# Patient Record
Sex: Female | Born: 1965
Health system: Southern US, Community
[De-identification: ages and names within clinical notes are randomized; demographics above are authoritative.]

## PROBLEM LIST (undated history)

## (undated) DIAGNOSIS — C50919 Malignant neoplasm of unspecified site of unspecified female breast: Secondary | ICD-10-CM

## (undated) DIAGNOSIS — C50412 Malignant neoplasm of upper-outer quadrant of left female breast: Principal | ICD-10-CM

## (undated) HISTORY — PX: WISDOM TOOTH EXTRACTION: SHX21

## (undated) HISTORY — DX: Malignant neoplasm of upper-outer quadrant of left female breast: C50.412

---

## 2000-05-04 ENCOUNTER — Other Ambulatory Visit: Admission: RE | Admit: 2000-05-04 | Discharge: 2000-05-04 | Payer: Self-pay | Admitting: Obstetrics and Gynecology

## 2002-06-26 ENCOUNTER — Other Ambulatory Visit: Admission: RE | Admit: 2002-06-26 | Discharge: 2002-06-26 | Payer: Self-pay | Admitting: Obstetrics and Gynecology

## 2005-11-09 ENCOUNTER — Other Ambulatory Visit: Admission: RE | Admit: 2005-11-09 | Discharge: 2005-11-09 | Payer: Self-pay | Admitting: Obstetrics and Gynecology

## 2005-12-14 ENCOUNTER — Ambulatory Visit (HOSPITAL_COMMUNITY): Admission: RE | Admit: 2005-12-14 | Discharge: 2005-12-14 | Payer: Self-pay | Admitting: Obstetrics and Gynecology

## 2008-01-16 ENCOUNTER — Ambulatory Visit (HOSPITAL_COMMUNITY): Admission: RE | Admit: 2008-01-16 | Discharge: 2008-01-16 | Payer: Self-pay | Admitting: Cardiology

## 2010-02-05 ENCOUNTER — Ambulatory Visit (HOSPITAL_COMMUNITY): Admission: RE | Admit: 2010-02-05 | Discharge: 2010-02-05 | Payer: Self-pay | Admitting: Family Medicine

## 2012-06-21 ENCOUNTER — Other Ambulatory Visit (HOSPITAL_COMMUNITY): Payer: Self-pay | Admitting: Family Medicine

## 2012-06-21 DIAGNOSIS — Z1231 Encounter for screening mammogram for malignant neoplasm of breast: Secondary | ICD-10-CM

## 2012-07-09 ENCOUNTER — Ambulatory Visit (HOSPITAL_COMMUNITY)
Admission: RE | Admit: 2012-07-09 | Discharge: 2012-07-09 | Disposition: A | Discharge status: Home / Self Care | Payer: 59 | Source: Ambulatory Visit | Attending: Family Medicine | Admitting: Family Medicine

## 2012-07-09 DIAGNOSIS — Z1231 Encounter for screening mammogram for malignant neoplasm of breast: Secondary | ICD-10-CM | POA: Insufficient documentation

## 2012-08-15 ENCOUNTER — Ambulatory Visit
Admission: RE | Admit: 2012-08-15 | Discharge: 2012-08-15 | Disposition: A | Discharge status: Home / Self Care | Payer: 59 | Source: Ambulatory Visit | Attending: Family Medicine | Admitting: Family Medicine

## 2012-08-15 ENCOUNTER — Other Ambulatory Visit: Payer: Self-pay | Admitting: Family Medicine

## 2012-08-15 DIAGNOSIS — R9389 Abnormal findings on diagnostic imaging of other specified body structures: Secondary | ICD-10-CM

## 2012-08-15 MED ORDER — IOHEXOL 300 MG/ML  SOLN
75.0000 mL | Freq: Once | INTRAMUSCULAR | Status: AC | PRN
  Administered 2012-08-15: 75 mL via INTRAVENOUS

## 2012-08-21 ENCOUNTER — Other Ambulatory Visit: Payer: Self-pay | Admitting: Family Medicine

## 2012-08-21 DIAGNOSIS — E041 Nontoxic single thyroid nodule: Secondary | ICD-10-CM

## 2012-08-24 ENCOUNTER — Ambulatory Visit
Admission: RE | Admit: 2012-08-24 | Discharge: 2012-08-24 | Disposition: A | Discharge status: Home / Self Care | Payer: 59 | Source: Ambulatory Visit | Attending: Family Medicine | Admitting: Family Medicine

## 2012-08-24 DIAGNOSIS — E041 Nontoxic single thyroid nodule: Secondary | ICD-10-CM

## 2013-01-23 ENCOUNTER — Other Ambulatory Visit: Payer: Self-pay | Admitting: Internal Medicine

## 2013-01-23 DIAGNOSIS — M7989 Other specified soft tissue disorders: Secondary | ICD-10-CM

## 2013-02-15 ENCOUNTER — Ambulatory Visit
Admission: RE | Admit: 2013-02-15 | Discharge: 2013-02-15 | Disposition: A | Discharge status: Home / Self Care | Payer: 59 | Source: Ambulatory Visit | Attending: Internal Medicine | Admitting: Internal Medicine

## 2013-02-15 ENCOUNTER — Other Ambulatory Visit: Payer: 59

## 2013-02-15 DIAGNOSIS — M7989 Other specified soft tissue disorders: Secondary | ICD-10-CM

## 2014-11-14 ENCOUNTER — Encounter (INDEPENDENT_AMBULATORY_CARE_PROVIDER_SITE_OTHER): Payer: BC Managed Care – PPO | Admitting: Ophthalmology

## 2014-11-14 DIAGNOSIS — H3562 Retinal hemorrhage, left eye: Secondary | ICD-10-CM

## 2014-11-14 DIAGNOSIS — H43813 Vitreous degeneration, bilateral: Secondary | ICD-10-CM

## 2014-11-14 DIAGNOSIS — H35711 Central serous chorioretinopathy, right eye: Secondary | ICD-10-CM

## 2015-11-23 ENCOUNTER — Ambulatory Visit (INDEPENDENT_AMBULATORY_CARE_PROVIDER_SITE_OTHER): Payer: BLUE CROSS/BLUE SHIELD | Admitting: Ophthalmology

## 2015-11-23 DIAGNOSIS — H43813 Vitreous degeneration, bilateral: Secondary | ICD-10-CM | POA: Diagnosis not present

## 2015-11-23 DIAGNOSIS — H35721 Serous detachment of retinal pigment epithelium, right eye: Secondary | ICD-10-CM

## 2016-06-14 ENCOUNTER — Other Ambulatory Visit: Payer: Self-pay | Admitting: Internal Medicine

## 2016-06-14 DIAGNOSIS — Z1231 Encounter for screening mammogram for malignant neoplasm of breast: Secondary | ICD-10-CM

## 2016-06-16 ENCOUNTER — Other Ambulatory Visit: Payer: Self-pay | Admitting: Internal Medicine

## 2016-06-16 DIAGNOSIS — N6320 Unspecified lump in the left breast, unspecified quadrant: Secondary | ICD-10-CM

## 2016-06-21 ENCOUNTER — Other Ambulatory Visit: Payer: Self-pay

## 2016-06-22 ENCOUNTER — Other Ambulatory Visit: Payer: Self-pay | Admitting: Radiology

## 2016-06-23 ENCOUNTER — Ambulatory Visit: Payer: Self-pay

## 2016-06-24 ENCOUNTER — Encounter: Payer: Self-pay | Admitting: *Deleted

## 2016-06-24 ENCOUNTER — Telehealth: Payer: Self-pay | Admitting: *Deleted

## 2016-06-24 DIAGNOSIS — C50412 Malignant neoplasm of upper-outer quadrant of left female breast: Secondary | ICD-10-CM | POA: Insufficient documentation

## 2016-06-24 HISTORY — DX: Malignant neoplasm of upper-outer quadrant of left female breast: C50.412

## 2016-06-24 NOTE — Telephone Encounter (Signed)
Confirmed [facility] for 07/06/16 at 0815 .  Instructions and contact information given.

## 2016-06-29 ENCOUNTER — Other Ambulatory Visit: Payer: Self-pay | Admitting: Audiologist

## 2016-06-29 ENCOUNTER — Other Ambulatory Visit: Payer: Self-pay | Admitting: Radiology

## 2016-06-29 DIAGNOSIS — C50912 Malignant neoplasm of unspecified site of left female breast: Secondary | ICD-10-CM

## 2016-07-05 ENCOUNTER — Ambulatory Visit
Admission: RE | Admit: 2016-07-05 | Discharge: 2016-07-05 | Disposition: A | Discharge status: Home / Self Care | Payer: BLUE CROSS/BLUE SHIELD | Source: Ambulatory Visit | Attending: Radiology | Admitting: Radiology

## 2016-07-05 DIAGNOSIS — C50912 Malignant neoplasm of unspecified site of left female breast: Secondary | ICD-10-CM

## 2016-07-05 MED ORDER — GADOBENATE DIMEGLUMINE 529 MG/ML IV SOLN: 12.0000 mL | Freq: Once | INTRAVENOUS | Status: DC | PRN

## 2016-07-06 ENCOUNTER — Other Ambulatory Visit (HOSPITAL_BASED_OUTPATIENT_CLINIC_OR_DEPARTMENT_OTHER): Payer: BLUE CROSS/BLUE SHIELD

## 2016-07-06 ENCOUNTER — Ambulatory Visit
Admission: RE | Admit: 2016-07-06 | Discharge: 2016-07-06 | Disposition: A | Discharge status: Home / Self Care | Payer: BLUE CROSS/BLUE SHIELD | Source: Ambulatory Visit | Attending: Radiation Oncology | Admitting: Radiation Oncology

## 2016-07-06 ENCOUNTER — Encounter: Payer: Self-pay | Admitting: Hematology and Oncology

## 2016-07-06 ENCOUNTER — Encounter: Payer: Self-pay | Admitting: Physical Therapy

## 2016-07-06 ENCOUNTER — Ambulatory Visit (HOSPITAL_BASED_OUTPATIENT_CLINIC_OR_DEPARTMENT_OTHER): Payer: BLUE CROSS/BLUE SHIELD | Admitting: Hematology and Oncology

## 2016-07-06 ENCOUNTER — Other Ambulatory Visit: Payer: Self-pay | Admitting: General Surgery

## 2016-07-06 ENCOUNTER — Ambulatory Visit: Payer: BLUE CROSS/BLUE SHIELD | Attending: General Surgery | Admitting: Physical Therapy

## 2016-07-06 ENCOUNTER — Encounter: Payer: Self-pay | Admitting: Skilled Nursing Facility1

## 2016-07-06 DIAGNOSIS — C50412 Malignant neoplasm of upper-outer quadrant of left female breast: Secondary | ICD-10-CM

## 2016-07-06 DIAGNOSIS — Z8042 Family history of malignant neoplasm of prostate: Secondary | ICD-10-CM

## 2016-07-06 DIAGNOSIS — C50912 Malignant neoplasm of unspecified site of left female breast: Secondary | ICD-10-CM

## 2016-07-06 DIAGNOSIS — Z803 Family history of malignant neoplasm of breast: Secondary | ICD-10-CM | POA: Diagnosis not present

## 2016-07-06 DIAGNOSIS — R293 Abnormal posture: Secondary | ICD-10-CM | POA: Diagnosis present

## 2016-07-06 LAB — COMPREHENSIVE METABOLIC PANEL
ALT: 11 U/L (ref 0–55)
ANION GAP: 9 meq/L (ref 3–11)
AST: 18 U/L (ref 5–34)
Albumin: 4.1 g/dL (ref 3.5–5.0)
Alkaline Phosphatase: 55 U/L (ref 40–150)
BUN: 10.4 mg/dL (ref 7.0–26.0)
CALCIUM: 9.6 mg/dL (ref 8.4–10.4)
CHLORIDE: 105 meq/L (ref 98–109)
CO2: 26 mEq/L (ref 22–29)
CREATININE: 0.7 mg/dL (ref 0.6–1.1)
Glucose: 82 mg/dl (ref 70–140)
POTASSIUM: 3.7 meq/L (ref 3.5–5.1)
Sodium: 141 mEq/L (ref 136–145)
Total Bilirubin: 0.5 mg/dL (ref 0.20–1.20)
Total Protein: 7 g/dL (ref 6.4–8.3)

## 2016-07-06 LAB — CBC WITH DIFFERENTIAL/PLATELET
BASO%: 0.7 % (ref 0.0–2.0)
Basophils Absolute: 0 10*3/uL (ref 0.0–0.1)
EOS%: 2.9 % (ref 0.0–7.0)
Eosinophils Absolute: 0.1 10*3/uL (ref 0.0–0.5)
HEMATOCRIT: 42.5 % (ref 34.8–46.6)
HGB: 14.2 g/dL (ref 11.6–15.9)
LYMPH%: 35.7 % (ref 14.0–49.7)
MCH: 32.3 pg (ref 25.1–34.0)
MCHC: 33.4 g/dL (ref 31.5–36.0)
MCV: 96.6 fL (ref 79.5–101.0)
MONO#: 0.5 10*3/uL (ref 0.1–0.9)
MONO%: 12.2 % (ref 0.0–14.0)
NEUT#: 2.2 10*3/uL (ref 1.5–6.5)
NEUT%: 48.5 % (ref 38.4–76.8)
PLATELETS: 210 10*3/uL (ref 145–400)
RBC: 4.4 10*6/uL (ref 3.70–5.45)
RDW: 13.4 % (ref 11.2–14.5)
WBC: 4.4 10*3/uL (ref 3.9–10.3)
lymph#: 1.6 10*3/uL (ref 0.9–3.3)

## 2016-07-06 NOTE — Patient Instructions (Signed)

## 2016-07-06 NOTE — Progress Notes (Signed)
[facility] CONSULT NOTE  Patient Care Team: Merrilee Seashore, MD as PCP - General (Internal Medicine) Rolm Bookbinder, MD as Consulting Physician (General Surgery) Nicholas Lose, MD as Consulting Physician (Hematology and Oncology) Kyung Rudd, MD as Consulting Physician (Radiation Oncology)  CHIEF COMPLAINTS/PURPOSE OF CONSULTATION:  Newly diagnosed breast cancer  HISTORY OF PRESENTING ILLNESS:  Stephanie Dennis 50 y.o. female is here because of recent diagnosis of left breast cancer. Patient initially had a screening mammogram which did not show any abnormalities. She however felt lumps in her breasts and in back in for reevaluation. She was found to have multiple breast nodules measuring 2.9 cm and 1:00 position, 0.6 cm at 1:00 position and 1.4 cm at 12:30 position. She also had what appeared to be fibroadenomas and right and left breast. Ultrasound-guided biopsy was performed which revealed invasive lobular carcinoma with lobular carcinoma in situ grade 2 along with lymphovascular invasion that was ER/PR positive HER-2 negative with a Ki-67 of 3%. She underwent a breast MRI which revealed a 5.5 cm central mass with multiple satellite nodules the overall span was 7.2 cm multicentric disease. It was abutting the skin. She was presented this morning in the multidisciplinary tumor board and she is here today to discuss a treatment plan of the [facility] clinic.  I reviewed her records extensively and collaborated the history with the patient.  SUMMARY OF ONCOLOGIC HISTORY:   Breast cancer of upper-outer quadrant of left female breast ([place])   06/22/2016 Initial Diagnosis    Left breast biopsies subareolar: 3 biopsies showing multifocal invasive lobular carcinoma with LCIS grade 2 with lymphovascular invasion, ER 80%, PR 90%, Ki-67 3%, HER-2 negative ratio 1.13     07/05/2016 Breast MRI    Left breast biopsy 7:00: Grade 2 invasive ductal carcinoma, ER 95%, PR 95%, Ki-67 15%, HER-2 negative  ratio 1.48; left breast distortion 1.2 x 1.2 x 0.8 cm; left breast cyst 1.1 cm, T1b N0 stage IA clinical stage      In terms of breast cancer risk profile:  She menarched at early age of 41  She had 2 pregnancy, her first child was born at age 55  She has received birth control pills for approximately 20 years.  She was never exposed to fertility medications or hormone replacement therapy.  She has  family history of Breast/GYN/GI cancer Mother 51 breast cancer Father 23 prostate cancer  MEDICAL HISTORY:  Past Medical History:  Diagnosis Date  . Breast cancer of upper-outer quadrant of left female breast ([place]) 06/24/2016    SURGICAL HISTORY: History reviewed. No pertinent surgical history.  SOCIAL HISTORY: Social History   Social History  . Marital status: Single    Spouse name: N/A  . Number of children: N/A  . Years of education: N/A   Occupational History  . Not on file.   Social History Main Topics  . Smoking status: Never Smoker  . Smokeless tobacco: Never Used  . Alcohol use 0.6 - 1.2 oz/week    1 - 2 Glasses of wine per week  . Drug use: No  . Sexual activity: Not on file   Other Topics Concern  . Not on file   Social History Narrative  . No narrative on file    FAMILY HISTORY: Family History  Problem Relation Age of Onset  . Breast cancer Mother   . Prostate cancer Father     ALLERGIES:  has No Known Allergies.  MEDICATIONS:  No current outpatient prescriptions on file.  No current facility-administered medications for this visit.     REVIEW OF SYSTEMS:   Constitutional: Denies fevers, chills or abnormal night sweats Eyes: Denies blurriness of vision, double vision or watery eyes Ears, nose, mouth, throat, and face: Denies mucositis or sore throat Respiratory: Denies cough, dyspnea or wheezes Cardiovascular: Denies palpitation, chest discomfort or lower extremity swelling Gastrointestinal:  Denies nausea, heartburn or change in bowel  habits Skin: Denies abnormal skin rashes Lymphatics: Denies new lymphadenopathy or easy bruising Neurological:Denies numbness, tingling or new weaknesses Behavioral/Psych: Mood is stable, no new changes  Breast:  Multiple palpable nodules in the left breast All other systems were reviewed with the patient and are negative.  PHYSICAL EXAMINATION: ECOG PERFORMANCE STATUS: 1 - Symptomatic but completely ambulatory  Vitals:   07/06/16 0845  BP: 137/85  Pulse: 70  Resp: 18  Temp: 98.6 F (37 C)   Filed Weights   07/06/16 0845  Weight: 131 lb 11.2 oz (59.7 kg)    GENERAL:alert, no distress and comfortable SKIN: skin color, texture, turgor are normal, no rashes or significant lesions EYES: normal, conjunctiva are pink and non-injected, sclera clear OROPHARYNX:no exudate, no erythema and lips, buccal mucosa, and tongue normal  NECK: supple, thyroid normal size, non-tender, without nodularity LYMPH:  no palpable lymphadenopathy in the cervical, axillary or inguinal LUNGS: clear to auscultation and percussion with normal breathing effort HEART: regular rate & rhythm and no murmurs and no lower extremity edema ABDOMEN:abdomen soft, non-tender and normal bowel sounds Musculoskeletal:no cyanosis of digits and no clubbing  PSYCH: alert & oriented x 3 with fluent speech NEURO: no focal motor/sensory deficits BREAST: Multiple palpable nodules in the left breast. No palpable axillary or supraclavicular lymphadenopathy (exam performed in the presence of a chaperone)   LABORATORY DATA:  I have reviewed the data as listed Lab Results  Component Value Date   WBC 4.4 07/06/2016   HGB 14.2 07/06/2016   HCT 42.5 07/06/2016   MCV 96.6 07/06/2016   PLT 210 07/06/2016   Lab Results  Component Value Date   NA 141 07/06/2016   K 3.7 07/06/2016   CO2 26 07/06/2016    RADIOGRAPHIC STUDIES: I have personally reviewed the radiological reports and agreed with the findings in the  report.  ASSESSMENT AND PLAN:  Breast cancer of upper-outer quadrant of left female breast ([place]) Left breast biopsy 7:00 06/22/2016: Grade 2 invasive ductal carcinoma, ER 95%, PR 95%, Ki-67 15%, HER-2 negative ratio 1.48; left breast distortion 1.2 x 1.2 x 0.8 cm; left breast cyst 1.1 cm, T1b N0 stage IA clinical stage Left breast biopsies subareolar: 3 biopsies showing multifocal invasive lobular carcinoma with LCIS grade 2 with lymphovascular invasion, ER 80%, PR 90%, Ki-67 3%, HER-2 negative ratio 1.13  Pathology and radiology counseling:Discussed with the patient, the details of pathology including the type of breast cancer,the clinical staging, the significance of ER, PR and HER-2/neu receptors and the implications for treatment. After reviewing the pathology in detail, we proceeded to discuss the different treatment options between surgery, radiation, chemotherapy, antiestrogen therapies.  Recommendations: 1. Left mastectomy followed by 2. Oncotype DX testing to determine if chemotherapy would be of any benefit followed by 3. Adjuvant radiation therapy followed by 4. Adjuvant antiestrogen therapy  Patient does not qualify for genetic counseling because she does not have to family members and one side of the family with breast cancer. Having mother with breast cancer father with prostate cancer does not qualify her for genetic testing  Oncotype counseling: I  discussed Oncotype DX test. I explained to the patient that this is a 21 gene panel to evaluate patient tumors DNA to calculate recurrence score. This would help determine whether patient has high risk or intermediate risk or low risk breast cancer. She understands that if her tumor was found to be high risk, she would benefit from systemic chemotherapy. If low risk, no need of chemotherapy. If she was found to be intermediate risk, we would need to evaluate the score as well as other risk factors and determine if an abbreviated  chemotherapy may be of benefit.  Return to clinic after surgery to discuss final pathology report.  All questions were answered. The patient knows to call the clinic with any problems, questions or concerns.    Rulon Eisenmenger, MD 07/06/16

## 2016-07-06 NOTE — Assessment & Plan Note (Signed)
Left breast biopsy 7:00 06/22/2016: Grade 2 invasive ductal carcinoma, ER 95%, PR 95%, Ki-67 15%, HER-2 negative ratio 1.48; left breast distortion 1.2 x 1.2 x 0.8 cm; left breast cyst 1.1 cm, T1b N0 stage IA clinical stage Left breast biopsies subareolar: 3 biopsies showing multifocal invasive lobular carcinoma with LCIS grade 2 with lymphovascular invasion, ER 80%, PR 90%, Ki-67 3%, HER-2 negative ratio 1.13  Pathology and radiology counseling:Discussed with the patient, the details of pathology including the type of breast cancer,the clinical staging, the significance of ER, PR and HER-2/neu receptors and the implications for treatment. After reviewing the pathology in detail, we proceeded to discuss the different treatment options between surgery, radiation, chemotherapy, antiestrogen therapies.  Recommendations: 1. Left mastectomy followed by 2. Oncotype DX testing to determine if chemotherapy would be of any benefit followed by 3. Adjuvant radiation therapy followed by 4. Adjuvant antiestrogen therapy  Oncotype counseling: I discussed Oncotype DX test. I explained to the patient that this is a 21 gene panel to evaluate patient tumors DNA to calculate recurrence score. This would help determine whether patient has high risk or intermediate risk or low risk breast cancer. She understands that if her tumor was found to be high risk, she would benefit from systemic chemotherapy. If low risk, no need of chemotherapy. If she was found to be intermediate risk, we would need to evaluate the score as well as other risk factors and determine if an abbreviated chemotherapy may be of benefit.  Return to clinic after surgery to discuss final pathology report.

## 2016-07-06 NOTE — Therapy (Signed)
[place], [place], [address] Phone: [phone]   Fax:  [phone]  Physical Therapy Evaluation  Patient Details  Name: Stephanie Dennis MRN: [id number] Date of Birth: 1966/01/10 Referring Provider: Dr. Rolm Bookbinder  Encounter Date: 07/06/2016      PT End of Session - 07/06/16 1301    Visit Number 1   Number of Visits 1   PT Start Time 0934   PT Stop Time 1000   PT Time Calculation (min) 26 min   Activity Tolerance Patient tolerated treatment well   Behavior During Therapy [facility] for tasks assessed/performed      Past Medical History:  Diagnosis Date  . Breast cancer of upper-outer quadrant of left female breast ([place]) 06/24/2016    History reviewed. No pertinent surgical history.  There were no vitals filed for this visit.       Subjective Assessment - 07/06/16 1302    Subjective Patient reports she is here today to meet with her medical team to be assessed for her newly diagnosed left breast cancer.   Patient is accompained by: Family member   Pertinent History Patient was diagnosed on 06/15/16 with left invasive lobular carcinoma.  It is ER/PR positive, HER2 negative with a Ki67 of 3%.  There are multiple masses totaling 7 cm.    Patient Stated Goals Reduce lymphedema risk and learn post op shoulder ROM HEP            [facility] PT Assessment - 07/06/16 0001      Assessment   Medical Diagnosis Left breast cancer   Referring Provider Dr. Rolm Bookbinder   Onset Date/Surgical Date 06/15/16   Hand Dominance Right   Prior Therapy none     Precautions   Precautions Other (comment)   Precaution Comments Active cancer     Restrictions   Weight Bearing Restrictions No     Balance Screen   Has the patient fallen in the past 6 months No   Has the patient had a decrease in activity level because of a fear of falling?  No   Is the patient reluctant to leave their home because of a fear  of falling?  No     Home Environment   Living Environment Private residence   Living Arrangements Children  74 y.o. twin sons   Available Help at Discharge Family     Prior Function   Level of Independence Independent   Vocation Full time employment   Vocation Requirements Works as an Corporate treasurer at [employer] She walks 4x/week for 45 minutes     Cognition   Overall Cognitive Status Within Functional Limits for tasks assessed     Posture/Postural Control   Posture/Postural Control Postural limitations   Postural Limitations Rounded Shoulders;Forward head     ROM / Strength   AROM / PROM / Strength AROM;Strength     AROM   AROM Assessment Site Shoulder;Cervical   Right/Left Shoulder Right;Left   Right Shoulder Extension 44 Degrees   Right Shoulder Flexion 148 Degrees   Right Shoulder ABduction 158 Degrees   Right Shoulder Internal Rotation 78 Degrees   Right Shoulder External Rotation 77 Degrees   Left Shoulder Extension 30 Degrees   Left Shoulder Flexion 140 Degrees   Left Shoulder ABduction 160 Degrees   Left Shoulder Internal Rotation 88 Degrees   Left Shoulder External Rotation 58 Degrees   Cervical Flexion WNL   Cervical Extension WNL  Cervical - Right Side Bend WNL   Cervical - Left Side Bend WNL   Cervical - Right Rotation WNL   Cervical - Left Rotation WNL     Strength   Overall Strength Within functional limits for tasks performed           LYMPHEDEMA/ONCOLOGY QUESTIONNAIRE - 07/06/16 1259      Type   Cancer Type Left breast cancer     Lymphedema Assessments   Lymphedema Assessments Upper extremities     Right Upper Extremity Lymphedema   10 cm Proximal to Olecranon Process 24.8 cm   Olecranon Process 23.3 cm   10 cm Proximal to Ulnar Styloid Process 20.1 cm   Just Proximal to Ulnar Styloid Process 14.5 cm   Across Hand at [employer] 18.2 cm   At [place] of 2nd Digit 5.8 cm     Left Upper Extremity Lymphedema   10 cm Proximal  to Olecranon Process 25.5 cm   Olecranon Process 24.4 cm   10 cm Proximal to Ulnar Styloid Process 20.1 cm   Just Proximal to Ulnar Styloid Process 14.4 cm   Across Hand at [employer] 17.1 cm   At [place] of 2nd Digit 5.7 cm      Patient was instructed today in a home exercise program today for post op shoulder range of motion. These included active assist shoulder flexion in sitting, scapular retraction, wall walking with shoulder abduction, and hands behind head external rotation.  She was encouraged to do these twice a day, holding 3 seconds and repeating 5 times when permitted by her physician.         PT Education - 07/06/16 1300    Education provided Yes   Education Details Lymphedema risk reduction and post op shoulder ROM HEP   Person(s) Educated Patient   Methods Explanation;Demonstration;Handout   Comprehension Returned demonstration;Verbalized understanding              Breast Clinic Goals - 07/06/16 1305      Patient will be able to verbalize understanding of pertinent lymphedema risk reduction practices relevant to her diagnosis specifically related to skin care.   Time 1   Period Days   Status Achieved     Patient will be able to return demonstrate and/or verbalize understanding of the post-op home exercise program related to regaining shoulder range of motion.   Time 1   Period Days   Status Achieved     Patient will be able to verbalize understanding of the importance of attending the postoperative After Breast Cancer Class for further lymphedema risk reduction education and therapeutic exercise.   Time 1   Period Days   Status Achieved              Plan - 07/06/16 1302    Clinical Impression Statement Patient was diagnosed on 06/15/16 with left invasive lobular carcinoma.  It is ER/PR positive, HER2 negative with a Ki67 of 3%.  There are multiple masses totaling 7 cm.  Her multidisciplinary medical team met prior to her assessment to determine  a recommended treatment plan.  She is planning to have a left mastectomy with a sentinel node biopsy followed by radiation and anti-estrogen therapy.  She may benefit from post op PT to regain shoulder ROM and reduce lymphedema risk.  Due to her lack of comorbidities, her eval is of low complexity.   Rehab Potential Excellent   Clinical Impairments Affecting Rehab Potential none   PT Frequency  One time visit   PT Treatment/Interventions Patient/family education;Therapeutic exercise   PT Next Visit Plan Will f/u after surgery   PT Home Exercise Plan Post op shoulder ROM HEP   Consulted and Agree with Plan of Care Patient;Family member/caregiver   Family Member Consulted Sister      Patient will benefit from skilled therapeutic intervention in order to improve the following deficits and impairments:  Decreased knowledge of precautions, Pain, Impaired UE functional use, Decreased range of motion  Visit Diagnosis: Left breast cancer with T3 tumor, >5 cm in greatest dimension (HCC) - Plan: PT plan of care cert/re-cert  Abnormal posture - Plan: PT plan of care cert/re-cert   Patient will follow up at outpatient cancer rehab if needed following surgery.  If the patient requires physical therapy at that time, a specific plan will be dictated and sent to the referring physician for approval. The patient was educated today on appropriate basic range of motion exercises to begin post operatively and the importance of attending the After Breast Cancer class following surgery.  Patient was educated today on lymphedema risk reduction practices as it pertains to recommendations that will benefit the patient immediately following surgery.  She verbalized good understanding.  No additional physical therapy is indicated at this time.      Problem List Patient Active Problem List   Diagnosis Date Noted  . Breast cancer of upper-outer quadrant of left female breast ([place]) 06/24/2016    Annia Friendly,  PT 07/06/16 1:07 PM  [place], [place], [address] Phone: [phone]   Fax:  [phone]  Name: AERITH CANAL MRN: [id number] Date of Birth: 1966-04-22

## 2016-07-06 NOTE — Progress Notes (Signed)
For the patient to understand and be given the tools to implement a healthy plant based diet during their cancer diagnosis.   Patient was seen today and found to be in good spirits and accompanied by her sister. Pts ht 67 in, 131 pounds, BMI 20.7. Pt states she usually likes all foods so she does not see a problem with implementing a plant based diet. Pt states she does skip multiple meals often throughout the day and week.    Dietitian educated the patient on implementing a plant based diet by incorporating more plant proteins, fruits, and vegetables. As a part of a healthy routine physical activity was discussed. Dietitian worked with the pt on some strategies to eat more frequently throughout the day and week. A folder of evidence based information with a focus on a plant based diet and general nutrition during cancer was given to the patient.  The importance of legitimate, evidence based information was discussed and examples were given. As a part of the continuum of care the cancer dietitian's contact information was given to the patient in the event they would like to have a follow up appointment.

## 2016-07-08 ENCOUNTER — Encounter (HOSPITAL_BASED_OUTPATIENT_CLINIC_OR_DEPARTMENT_OTHER): Payer: Self-pay | Admitting: *Deleted

## 2016-07-08 NOTE — Progress Notes (Signed)
Pt given 8 oz carton of boost breeze with written and verbal instructions to drink by 1100 am morning of surgery and no other liquid after midnight. Pt voiced understanding,

## 2016-07-10 NOTE — Progress Notes (Signed)
Radiation Oncology         [phone] ________________________________  Name: Stephanie Dennis MRN: [id number]  Date: 07/06/2016  DOB: 1966/11/04  UU:[id number],QQVZD, MD  Rolm Bookbinder, MD     REFERRING PHYSICIAN: Rolm Bookbinder, MD   DIAGNOSIS: The encounter diagnosis was Breast cancer of upper-outer quadrant of left female breast [facility]).  Breast cancer of upper-outer quadrant of left female breast [facility])   Staging form: Breast, AJCC 7th Edition   - Clinical stage from 07/06/2016: Stage IIB (T3(m), N0, M0) - Signed by Nicholas Lose, MD on 07/06/2016   HISTORY OF PRESENT ILLNESS:: Stephanie Dennis 50 y.o. female is here because of recent diagnosis of left breast cancer. Patient initially had a screening mammogram which did not show any abnormalities. She however felt lumps in her breasts and in back in for reevaluation. She was found to have multiple breast nodules measuring 2.9 cm and 1:00 position, 0.6 cm at 1:00 position and 1.4 cm at 12:30 position. She also had what appeared to be fibroadenomas and right and left breast. Ultrasound-guided biopsy was performed which revealed invasive lobular carcinoma with lobular carcinoma in situ grade 2 along with lymphovascular invasion that was ER/PR positive HER-2 negative with a Ki-67 of 3%. She underwent a breast MRI which revealed a 5.5 cm central mass with multiple satellite nodules the overall span was 7.2 cm multicentric disease. It was abutting the skin. She was presented this morning in the multidisciplinary tumor board and she is here today to discuss a treatment plan of the [facility] clinic.    PREVIOUS RADIATION THERAPY: No   PAST MEDICAL HISTORY:  has a past medical history of Breast cancer of upper-outer quadrant of left female breast ([place]) (06/24/2016).     PAST SURGICAL HISTORY: Past Surgical History:  Procedure Laterality Date  . CESAREAN SECTION    . WISDOM TOOTH EXTRACTION       FAMILY HISTORY: family history includes  Breast cancer in her mother; Prostate cancer in her father.   SOCIAL HISTORY:  reports that she has never smoked. She has never used smokeless tobacco. She reports that she drinks about 0.6 - 1.2 oz of alcohol per week . She reports that she does not use drugs.   ALLERGIES: Review of patient's allergies indicates no known allergies.   MEDICATIONS:  No current outpatient prescriptions on file.   No current facility-administered medications for this encounter.      REVIEW OF SYSTEMS:  A 15 point review of systems is documented in the electronic medical record. This was obtained by the nursing staff. However, I reviewed this with the patient to discuss relevant findings and make appropriate changes.  Pertinent items are noted in HPI.    PHYSICAL EXAM:  vitals were not taken for this visit.  ECOG = 0  0 - Asymptomatic (Fully active, able to carry on all predisease activities without restriction)  1 - Symptomatic but completely ambulatory (Restricted in physically strenuous activity but ambulatory and able to carry out work of a light or sedentary nature. For example, light housework, office work)  2 - Symptomatic, <50% in bed during the day (Ambulatory and capable of all self care but unable to carry out any work activities. Up and about more than 50% of waking hours)  3 - Symptomatic, >50% in bed, but not bedbound (Capable of only limited self-care, confined to bed or chair 50% or more of waking hours)  4 - Bedbound (Completely disabled. Cannot carry on any self-care.  Totally confined to bed or chair)  5 - Death   Eustace Pen MM, Creech RH, Tormey DC, et al. [phone]). "Toxicity and response criteria of the [facility]". [place] Oncol. 5 (6): 649-55  General: Well-developed, in no acute distress     LABORATORY DATA:  Lab Results  Component Value Date   WBC 4.4 07/06/2016   HGB 14.2 07/06/2016   HCT 42.5 07/06/2016   MCV 96.6 07/06/2016   PLT 210  07/06/2016   Lab Results  Component Value Date   NA 141 07/06/2016   K 3.7 07/06/2016   CO2 26 07/06/2016   Lab Results  Component Value Date   ALT 11 07/06/2016   AST 18 07/06/2016   ALKPHOS 55 07/06/2016   BILITOT 0.50 07/06/2016      RADIOGRAPHY: Mr Breast Bilateral W Wo Contrast  Result Date: 07/05/2016 CLINICAL DATA:  3 biopsy proven regions of breast cancer in the left breast. LABS:  None EXAM: BILATERAL BREAST MRI WITH AND WITHOUT CONTRAST TECHNIQUE: Multiplanar, multisequence MR images of both breasts were obtained prior to and following the intravenous administration of 12 ml of MultiHance. THREE-DIMENSIONAL MR IMAGE RENDERING ON INDEPENDENT WORKSTATION: Three-dimensional MR images were rendered by post-processing of the original MR data on an independent workstation. The three-dimensional MR images were interpreted, and findings are reported in the following complete MRI report for this study. Three dimensional images were evaluated at the independent DynaCad workstation COMPARISON:  Outside mammography June 15, 2016. Other mammograms since 2013. FINDINGS: Breast composition: Heterogeneously dense glandular tissue Background parenchymal enhancement: Moderate. Right breast: No MRI evidence of malignancy in the right breast. A few mildly prominent vessels are seen posteriorly in the medial right breast. No suspicious masses or non mass enhancement. Left breast: Extensive malignancy is seen in the left breast. The most anterior biopsy clip is in the largest mass which measures 2.1 x 3.0 cm in transverse an AP dimensions. The mass is largely centered above the nipple but does send finger-like projections inferior to the nipple. Anteriorly, the mass extends to the undersurface of the skin with no definitive skin thickening. There is a small amount of enhancement in the skin as seen on series 17, image 113. The primary mass is also centered laterally but does extend across midline into the  medial aspect of the breast. There are multiple enhancing nodules surround the primary mass. An enhancing nodule superiorly in the left breast on series 17, image 81 is 1 of the patient's known biopsied malignancies measuring 8 mm. Another enhancing nodule posteriorly in the lateral superior left breast represents another of the patient's known biopsy-proven malignancies measuring 10 mm. Multiple other enhancing nodules are are seen between the main mass and the 2 other biopsy proven malignancies. There is also non mass enhancement extending posteriorly in the breast on series 17, image 107 spanning at least 3.5 cm. Multiple enhancing nodules also are seen more inferiorly in the breasts, also worrisome for satellite lesions. The most inferior enhancing nodule is seen on series 7, image 150 measuring 5 mm. The total extent of expected malignancy counting apparent surrounding satellite lesions is 7.2 cm in cranial caudal dimension, 4.9 cm an AP dimension and 4.1 cm in transverse dimension. Lymph nodes: No abnormal appearing lymph nodes. Ancillary findings:  None. IMPRESSION: 1. Extensive malignancy is seen in the left breast. Based on biopsy proven malignancies, the greatest span of disease is at least 5.5 cm. However, there are numerous surrounding enhancing nodules most  consistent with satellite lesions. The span of malignancy is 7.2 x 4.9 x 4.1 cm in cranial caudal, AP, and transverse dimensions when including the apparent satellite disease. Additionally, the malignancy is multi centric involving the upper outer, upper inner, and lower inner breast. 2. No MRI evidence of malignancy in the right breast. RECOMMENDATION: No further biopsies are necessary unless the patient is considering breast conservation. If the patient is considering breast conservation, MRI biopsies could be utilized to confirm extent of disease. BI-RADS CATEGORY  6: Known biopsy-proven malignancy. Electronically Signed   By: Dorise Bullion III  M.D   On: 07/05/2016 17:12       IMPRESSION:    Breast cancer of upper-outer quadrant of left female breast ([place])   06/22/2016 Initial Diagnosis    Left breast biopsies subareolar: 3 biopsies showing multifocal invasive lobular carcinoma with LCIS grade 2 with lymphovascular invasion, ER 80%, PR 90%, Ki-67 3%, HER-2 negative ratio 1.13     07/05/2016 Breast MRI    Left breast biopsy 7:00: Grade 2 invasive ductal carcinoma, ER 95%, PR 95%, Ki-67 15%, HER-2 negative ratio 1.48; left breast distortion 1.2 x 1.2 x 0.8 cm; left breast cyst 1.1 cm, T1b N0 stage IA clinical stage      Breast cancer of upper-outer quadrant of left female breast ([place])   Staging form: Breast, AJCC 7th Edition   - Clinical stage from 07/06/2016: Stage IIB (T3(m), N0, M0) - Signed by Nicholas Lose, MD on 07/06/2016   The patient has a recent diagnosis of multifocal invasive lobular carcinoma of the left breast. She appears to be a good candidate for mastectomy on the left and she does wish to proceed with this.  I discussed with the patient the role of adjuvant radiation treatment in this setting. We discussed the potential benefit of radiation treatment, especially with regards to local control of the patient's tumor. We also discussed the possible side effects and risks of such a treatment as well. We discussed that based on the current information, it is quite likely that the patient would require postmastectomy radiation treatment. However, she understands that this decision would be made with her final pathologic results.  All of the patient's questions were answered. The patient wishes to proceed with radiation treatment at the appropriate time as necessary.  PLAN: I look forward to seeing the patient postoperatively to review her case and further discuss and coordinate an anticipated course of radiation treatment. The plan therefore will need to proceed with a mastectomy followed by obtaining an Oncotype test. Depending  on her final results, she will then proceed with postmastectomy radiation treatment at the appropriate time.      ________________________________   Jodelle Gross, MD, PhD   **Disclaimer: This note was dictated with voice recognition software. Similar sounding words can inadvertently be transcribed and this note may contain transcription errors which may not have been corrected upon publication of note.**

## 2016-07-11 ENCOUNTER — Ambulatory Visit (HOSPITAL_BASED_OUTPATIENT_CLINIC_OR_DEPARTMENT_OTHER): Payer: BLUE CROSS/BLUE SHIELD | Admitting: Certified Registered"

## 2016-07-11 ENCOUNTER — Ambulatory Visit (HOSPITAL_BASED_OUTPATIENT_CLINIC_OR_DEPARTMENT_OTHER)
Admission: RE | Admit: 2016-07-11 | Discharge: 2016-07-12 | Disposition: A | Discharge status: Home / Self Care | Payer: BLUE CROSS/BLUE SHIELD | Source: Ambulatory Visit | Attending: General Surgery | Admitting: General Surgery

## 2016-07-11 ENCOUNTER — Encounter (HOSPITAL_BASED_OUTPATIENT_CLINIC_OR_DEPARTMENT_OTHER)
Admission: RE | Disposition: A | Discharge status: Home / Self Care | Payer: Self-pay | Source: Ambulatory Visit | Attending: General Surgery

## 2016-07-11 ENCOUNTER — Other Ambulatory Visit: Payer: Self-pay | Admitting: *Deleted

## 2016-07-11 ENCOUNTER — Encounter (HOSPITAL_COMMUNITY)
Admission: RE | Admit: 2016-07-11 | Discharge: 2016-07-11 | Disposition: A | Discharge status: Home / Self Care | Payer: BLUE CROSS/BLUE SHIELD | Source: Ambulatory Visit | Attending: General Surgery | Admitting: General Surgery

## 2016-07-11 ENCOUNTER — Encounter (HOSPITAL_BASED_OUTPATIENT_CLINIC_OR_DEPARTMENT_OTHER): Payer: Self-pay

## 2016-07-11 DIAGNOSIS — C50412 Malignant neoplasm of upper-outer quadrant of left female breast: Secondary | ICD-10-CM | POA: Diagnosis not present

## 2016-07-11 DIAGNOSIS — Z87891 Personal history of nicotine dependence: Secondary | ICD-10-CM | POA: Diagnosis not present

## 2016-07-11 DIAGNOSIS — C50912 Malignant neoplasm of unspecified site of left female breast: Secondary | ICD-10-CM | POA: Diagnosis present

## 2016-07-11 HISTORY — PX: MASTECTOMY W/ SENTINEL NODE BIOPSY: SHX2001

## 2016-07-11 SURGERY — MASTECTOMY WITH SENTINEL LYMPH NODE BIOPSY
Anesthesia: Regional | Site: Breast | Laterality: Left

## 2016-07-11 MED ORDER — ACETAMINOPHEN 500 MG PO TABS
1000.0000 mg | ORAL_TABLET | ORAL | Status: AC
  Administered 2016-07-11: 1000 mg via ORAL

## 2016-07-11 MED ORDER — CEFAZOLIN SODIUM-DEXTROSE 2-4 GM/100ML-% IV SOLN
2.0000 g | INTRAVENOUS | Status: AC
  Administered 2016-07-11: 2 g via INTRAVENOUS

## 2016-07-11 MED ORDER — ONDANSETRON HCL 4 MG/2ML IJ SOLN
INTRAMUSCULAR | Status: DC | PRN
  Administered 2016-07-11: 4 mg via INTRAVENOUS

## 2016-07-11 MED ORDER — FENTANYL CITRATE (PF) 100 MCG/2ML IJ SOLN
INTRAMUSCULAR | Status: AC
  Filled 2016-07-11: qty 2

## 2016-07-11 MED ORDER — SIMETHICONE 80 MG PO CHEW: 40.0000 mg | CHEWABLE_TABLET | Freq: Four times a day (QID) | ORAL | Status: DC | PRN

## 2016-07-11 MED ORDER — METHYLENE BLUE 0.5 % INJ SOLN
INTRAVENOUS | Status: AC
  Filled 2016-07-11: qty 10

## 2016-07-11 MED ORDER — PROMETHAZINE HCL 25 MG/ML IJ SOLN: 6.2500 mg | INTRAMUSCULAR | Status: DC | PRN

## 2016-07-11 MED ORDER — PROPOFOL 10 MG/ML IV BOLUS
INTRAVENOUS | Status: AC
  Filled 2016-07-11: qty 20

## 2016-07-11 MED ORDER — TECHNETIUM TC 99M SULFUR COLLOID FILTERED
1.0000 | Freq: Once | INTRAVENOUS | Status: AC | PRN
  Administered 2016-07-11: 1 via INTRADERMAL

## 2016-07-11 MED ORDER — SODIUM CHLORIDE 0.9 % IJ SOLN
INTRAMUSCULAR | Status: AC
  Filled 2016-07-11: qty 10

## 2016-07-11 MED ORDER — DEXAMETHASONE SODIUM PHOSPHATE 4 MG/ML IJ SOLN
INTRAMUSCULAR | Status: DC | PRN
  Administered 2016-07-11: 10 mg via INTRAVENOUS

## 2016-07-11 MED ORDER — MIDAZOLAM HCL 2 MG/2ML IJ SOLN
1.0000 mg | INTRAMUSCULAR | Status: DC | PRN
  Administered 2016-07-11: 2 mg via INTRAVENOUS
  Administered 2016-07-11: 1 mg via INTRAVENOUS

## 2016-07-11 MED ORDER — ONDANSETRON 4 MG PO TBDP: 4.0000 mg | ORAL_TABLET | Freq: Four times a day (QID) | ORAL | Status: DC | PRN

## 2016-07-11 MED ORDER — OXYCODONE HCL 5 MG PO TABS: 5.0000 mg | ORAL_TABLET | Freq: Four times a day (QID) | ORAL | 0 refills | Status: DC | PRN

## 2016-07-11 MED ORDER — CELECOXIB 400 MG PO CAPS
400.0000 mg | ORAL_CAPSULE | ORAL | Status: AC
  Administered 2016-07-11: 400 mg via ORAL

## 2016-07-11 MED ORDER — FENTANYL CITRATE (PF) 100 MCG/2ML IJ SOLN
50.0000 ug | INTRAMUSCULAR | Status: AC | PRN
  Administered 2016-07-11: 50 ug via INTRAVENOUS
  Administered 2016-07-11 (×2): 25 ug via INTRAVENOUS

## 2016-07-11 MED ORDER — GABAPENTIN 300 MG PO CAPS
300.0000 mg | ORAL_CAPSULE | ORAL | Status: AC
  Administered 2016-07-11: 300 mg via ORAL

## 2016-07-11 MED ORDER — CHLORHEXIDINE GLUCONATE CLOTH 2 % EX PADS: 6.0000 | MEDICATED_PAD | Freq: Once | CUTANEOUS | Status: DC

## 2016-07-11 MED ORDER — ACETAMINOPHEN 500 MG PO TABS
ORAL_TABLET | ORAL | Status: AC
  Filled 2016-07-11: qty 2

## 2016-07-11 MED ORDER — MORPHINE SULFATE (PF) 2 MG/ML IV SOLN: 2.0000 mg | INTRAVENOUS | Status: DC | PRN

## 2016-07-11 MED ORDER — SODIUM CHLORIDE 0.9 % IV SOLN
INTRAVENOUS | Status: DC
  Administered 2016-07-11: 17:00:00 via INTRAVENOUS

## 2016-07-11 MED ORDER — GLYCOPYRROLATE 0.2 MG/ML IJ SOLN: 0.2000 mg | Freq: Once | INTRAMUSCULAR | Status: DC | PRN

## 2016-07-11 MED ORDER — FENTANYL CITRATE (PF) 100 MCG/2ML IJ SOLN
25.0000 ug | INTRAMUSCULAR | Status: DC | PRN
  Administered 2016-07-11: 25 ug via INTRAVENOUS

## 2016-07-11 MED ORDER — LIDOCAINE HCL (CARDIAC) 20 MG/ML IV SOLN
INTRAVENOUS | Status: DC | PRN
  Administered 2016-07-11: 30 mg via INTRAVENOUS

## 2016-07-11 MED ORDER — MIDAZOLAM HCL 2 MG/2ML IJ SOLN
INTRAMUSCULAR | Status: AC
  Filled 2016-07-11: qty 2

## 2016-07-11 MED ORDER — DEXAMETHASONE SODIUM PHOSPHATE 10 MG/ML IJ SOLN
INTRAMUSCULAR | Status: AC
  Filled 2016-07-11: qty 1

## 2016-07-11 MED ORDER — ACETAMINOPHEN 650 MG RE SUPP: 650.0000 mg | Freq: Four times a day (QID) | RECTAL | Status: DC | PRN

## 2016-07-11 MED ORDER — METHOCARBAMOL 500 MG PO TABS: 500.0000 mg | ORAL_TABLET | Freq: Four times a day (QID) | ORAL | Status: DC | PRN

## 2016-07-11 MED ORDER — OXYCODONE HCL 5 MG PO TABS
5.0000 mg | ORAL_TABLET | ORAL | Status: DC | PRN
  Administered 2016-07-11 – 2016-07-12 (×3): 5 mg via ORAL
  Filled 2016-07-11 (×3): qty 1

## 2016-07-11 MED ORDER — CELECOXIB 200 MG PO CAPS
ORAL_CAPSULE | ORAL | Status: AC
  Filled 2016-07-11: qty 2

## 2016-07-11 MED ORDER — LIDOCAINE 2% (20 MG/ML) 5 ML SYRINGE
INTRAMUSCULAR | Status: AC
  Filled 2016-07-11: qty 5

## 2016-07-11 MED ORDER — ONDANSETRON HCL 4 MG/2ML IJ SOLN
INTRAMUSCULAR | Status: AC
  Filled 2016-07-11: qty 2

## 2016-07-11 MED ORDER — ONDANSETRON HCL 4 MG/2ML IJ SOLN: 4.0000 mg | Freq: Four times a day (QID) | INTRAMUSCULAR | Status: DC | PRN

## 2016-07-11 MED ORDER — SCOPOLAMINE 1 MG/3DAYS TD PT72: 1.0000 | MEDICATED_PATCH | Freq: Once | TRANSDERMAL | Status: DC | PRN

## 2016-07-11 MED ORDER — BUPIVACAINE HCL (PF) 0.25 % IJ SOLN
INTRAMUSCULAR | Status: AC
  Filled 2016-07-11: qty 30

## 2016-07-11 MED ORDER — PROPOFOL 10 MG/ML IV BOLUS
INTRAVENOUS | Status: DC | PRN
  Administered 2016-07-11: 160 mg via INTRAVENOUS

## 2016-07-11 MED ORDER — ACETAMINOPHEN 325 MG PO TABS: 650.0000 mg | ORAL_TABLET | Freq: Four times a day (QID) | ORAL | Status: DC | PRN

## 2016-07-11 MED ORDER — LACTATED RINGERS IV SOLN
INTRAVENOUS | Status: DC
  Administered 2016-07-11 (×2): via INTRAVENOUS

## 2016-07-11 MED ORDER — GABAPENTIN 300 MG PO CAPS
ORAL_CAPSULE | ORAL | Status: AC
  Filled 2016-07-11: qty 1

## 2016-07-11 MED ORDER — CEFAZOLIN SODIUM-DEXTROSE 2-4 GM/100ML-% IV SOLN
INTRAVENOUS | Status: AC
  Filled 2016-07-11: qty 100

## 2016-07-11 MED ORDER — BUPIVACAINE-EPINEPHRINE (PF) 0.25% -1:200000 IJ SOLN
INTRAMUSCULAR | Status: DC | PRN
  Administered 2016-07-11: 30 mL

## 2016-07-11 SURGICAL SUPPLY — 80 items
APL SKNCLS STERI-STRIP NONHPOA (GAUZE/BANDAGES/DRESSINGS)
APPLIER CLIP 11 MED OPEN (CLIP)
APR CLP MED 11 20 MLT OPN (CLIP)
BENZOIN TINCTURE PRP APPL 2/3 (GAUZE/BANDAGES/DRESSINGS) ×1 IMPLANT
BINDER BREAST LRG (GAUZE/BANDAGES/DRESSINGS) ×1 IMPLANT
BINDER BREAST MEDIUM (GAUZE/BANDAGES/DRESSINGS) ×5 IMPLANT
BINDER BREAST XLRG (GAUZE/BANDAGES/DRESSINGS) ×1 IMPLANT
BINDER BREAST XXLRG (GAUZE/BANDAGES/DRESSINGS) ×1 IMPLANT
BIOPATCH RED 1 DISK 7.0 (GAUZE/BANDAGES/DRESSINGS) ×1 IMPLANT
BIOPATCH RED 1IN DISK 7.0MM (GAUZE/BANDAGES/DRESSINGS) ×1
BLADE CLIPPER SURG (BLADE) IMPLANT
BLADE HEX COATED 2.75 (ELECTRODE) ×3 IMPLANT
BLADE SURG 10 STRL SS (BLADE) ×3 IMPLANT
BLADE SURG 15 STRL LF DISP TIS (BLADE) ×1 IMPLANT
BLADE SURG 15 STRL SS (BLADE) ×3
CANISTER SUCT 1200ML W/VALVE (MISCELLANEOUS) ×3 IMPLANT
CHLORAPREP W/TINT 26ML (MISCELLANEOUS) ×3 IMPLANT
CLIP APPLIE 11 MED OPEN (CLIP) IMPLANT
CLIP TI WIDE RED SMALL 6 (CLIP) IMPLANT
CLOSURE WOUND 1/2 X4 (GAUZE/BANDAGES/DRESSINGS) ×2
COVER BACK TABLE 60X90IN (DRAPES) ×3 IMPLANT
COVER MAYO STAND STRL (DRAPES) ×3 IMPLANT
COVER PROBE W GEL 5X96 (DRAPES) ×3 IMPLANT
DECANTER SPIKE VIAL GLASS SM (MISCELLANEOUS) ×1 IMPLANT
DEVICE DISSECT PLASMABLAD 3.0S (MISCELLANEOUS) IMPLANT
DRAIN CHANNEL 19F RND (DRAIN) ×3 IMPLANT
DRAPE LAPAROSCOPIC ABDOMINAL (DRAPES) ×3 IMPLANT
DRAPE UTILITY XL STRL (DRAPES) ×3 IMPLANT
DRSG TEGADERM 2-3/8X2-3/4 SM (GAUZE/BANDAGES/DRESSINGS) ×4 IMPLANT
DRSG TEGADERM 4X4.75 (GAUZE/BANDAGES/DRESSINGS) IMPLANT
ELECT BLADE 4.0 EZ CLEAN MEGAD (MISCELLANEOUS)
ELECT REM PT RETURN 9FT ADLT (ELECTROSURGICAL) ×3
ELECTRODE BLDE 4.0 EZ CLN MEGD (MISCELLANEOUS) IMPLANT
ELECTRODE REM PT RTRN 9FT ADLT (ELECTROSURGICAL) ×1 IMPLANT
EVACUATOR SILICONE 100CC (DRAIN) ×3 IMPLANT
GLOVE BIO SURGEON STRL SZ7 (GLOVE) ×3 IMPLANT
GLOVE BIOGEL M STRL SZ7.5 (GLOVE) ×2 IMPLANT
GLOVE BIOGEL PI IND STRL 7.5 (GLOVE) ×1 IMPLANT
GLOVE BIOGEL PI IND STRL 8 (GLOVE) IMPLANT
GLOVE BIOGEL PI INDICATOR 7.5 (GLOVE) ×2
GLOVE BIOGEL PI INDICATOR 8 (GLOVE) ×2
GLOVE EXAM NITRILE EXT CUFF MD (GLOVE) ×2 IMPLANT
GOWN STRL REUS W/ TWL LRG LVL3 (GOWN DISPOSABLE) ×3 IMPLANT
GOWN STRL REUS W/ TWL XL LVL3 (GOWN DISPOSABLE) IMPLANT
GOWN STRL REUS W/TWL LRG LVL3 (GOWN DISPOSABLE) ×3
GOWN STRL REUS W/TWL XL LVL3 (GOWN DISPOSABLE) ×3
HEMOSTAT ARISTA ABSORB 3G PWDR (MISCELLANEOUS) ×2 IMPLANT
ILLUMINATOR WAVEGUIDE N/F (MISCELLANEOUS) IMPLANT
LIGHT WAVEGUIDE WIDE FLAT (MISCELLANEOUS) IMPLANT
LIQUID BAND (GAUZE/BANDAGES/DRESSINGS) ×3 IMPLANT
NDL HYPO 25X1 1.5 SAFETY (NEEDLE) ×1 IMPLANT
NDL SAFETY ECLIPSE 18X1.5 (NEEDLE) IMPLANT
NEEDLE HYPO 18GX1.5 SHARP (NEEDLE)
NEEDLE HYPO 25X1 1.5 SAFETY (NEEDLE) IMPLANT
NS IRRIG 1000ML POUR BTL (IV SOLUTION) ×3 IMPLANT
PACK BASIN DAY SURGERY FS (CUSTOM PROCEDURE TRAY) ×3 IMPLANT
PENCIL BUTTON HOLSTER BLD 10FT (ELECTRODE) ×3 IMPLANT
PIN SAFETY STERILE (MISCELLANEOUS) ×3 IMPLANT
PLASMABLADE 3.0S (MISCELLANEOUS)
SHEET MEDIUM DRAPE 40X70 STRL (DRAPES) IMPLANT
SLEEVE SCD COMPRESS KNEE MED (MISCELLANEOUS) ×3 IMPLANT
SPONGE GAUZE 4X4 12PLY STER LF (GAUZE/BANDAGES/DRESSINGS) IMPLANT
SPONGE LAP 18X18 X RAY DECT (DISPOSABLE) ×3 IMPLANT
SPONGE LAP 4X18 X RAY DECT (DISPOSABLE) IMPLANT
STOCKINETTE IMPERVIOUS LG (DRAPES) IMPLANT
STRIP CLOSURE SKIN 1/2X4 (GAUZE/BANDAGES/DRESSINGS) ×3 IMPLANT
SUT ETHILON 2 0 FS 18 (SUTURE) ×3 IMPLANT
SUT MNCRL AB 4-0 PS2 18 (SUTURE) ×2 IMPLANT
SUT SILK 2 0 SH (SUTURE) IMPLANT
SUT VIC AB 3-0 54X BRD REEL (SUTURE) IMPLANT
SUT VIC AB 3-0 BRD 54 (SUTURE)
SUT VIC AB 3-0 SH 27 (SUTURE)
SUT VIC AB 3-0 SH 27X BRD (SUTURE) IMPLANT
SUT VICRYL 3-0 CR8 SH (SUTURE) ×3 IMPLANT
SYR CONTROL 10ML LL (SYRINGE) ×1 IMPLANT
TOWEL OR 17X24 6PK STRL BLUE (TOWEL DISPOSABLE) ×6 IMPLANT
TOWEL OR NON WOVEN STRL DISP B (DISPOSABLE) ×1 IMPLANT
TUBE CONNECTING 20'X1/4 (TUBING) ×1
TUBE CONNECTING 20X1/4 (TUBING) ×2 IMPLANT
YANKAUER SUCT BULB TIP NO VENT (SUCTIONS) ×3 IMPLANT

## 2016-07-11 NOTE — H&P (Signed)
50 yof who has palpable left breast mass times two weeks. She is referred by Dr Merrilee Seashore. she has fh in mom at age 50. she underwent mm that shows density d breasts. she has no prior history. there was no mammographic abnormality. There is a 3 cm, 6 mm and 1.4 cm mass in the left breast. all three of these underwent biopsy, the clips are 5 cm apart. by [place] she also has a likely right and left sided fa. mri shows no abnormality in right breast, left breast shows multiple nodules that end up measuring 7.2x4.9x4.1 cm in size. there are no abnormal nodes. all three biopsies show ilc with lcis, grade II, with lvi, this is er/pr positive, her2 negative, Ki is 3%. she has no nipple discharge.   Past Surgical History Tawni Pummel, RN; 07/06/2016 7:52 AM) Breast Biopsy Left. Cesarean Section - 1  Diagnostic Studies History Tawni Pummel, RN; 07/06/2016 7:52 AM) Colonoscopy never Mammogram within last year Pap Smear 1-5 years ago  Medication History Tawni Pummel, RN; 07/06/2016 7:52 AM) Medications Reconciled  Social History Tawni Pummel, RN; 07/06/2016 7:52 AM) Alcohol use Occasional alcohol use. Caffeine use Coffee. No drug use Tobacco use Former smoker.  Family History Tawni Pummel, RN; 07/06/2016 7:52 AM) Breast Cancer Mother. Cerebrovascular Accident Father. Prostate Cancer Father.  Pregnancy / Birth History Tawni Pummel, RN; 07/06/2016 7:52 AM) Age at menarche 50 years. Contraceptive History Oral contraceptives. Gravida 1 Irregular periods Maternal age 19-35 Para 2    Review of Systems Tawni Pummel RN; 07/06/2016 7:52 AM) General Not Present- Appetite Loss, Chills, Fatigue, Fever, Night Sweats, Weight Gain and Weight Loss. Skin Not Present- Change in Wart/Mole, Dryness, Hives, Jaundice, New Lesions, Non-Healing Wounds, Rash and Ulcer. HEENT Not Present- Earache, Hearing Loss, Hoarseness, Nose Bleed, Oral Ulcers, Ringing in the Ears,  Seasonal Allergies, Sinus Pain, Sore Throat, Visual Disturbances, Wears glasses/contact lenses and Yellow Eyes. Respiratory Not Present- Bloody sputum, Chronic Cough, Difficulty Breathing, Snoring and Wheezing. Breast Present- Breast Mass. Not Present- Breast Pain, Nipple Discharge and Skin Changes. Cardiovascular Not Present- Chest Pain, Difficulty Breathing Lying Down, Leg Cramps, Palpitations, Rapid Heart Rate, Shortness of Breath and Swelling of Extremities. Gastrointestinal Not Present- Abdominal Pain, Bloating, Bloody Stool, Change in Bowel Habits, Chronic diarrhea, Constipation, Difficulty Swallowing, Excessive gas, Gets full quickly at meals, Hemorrhoids, Indigestion, Nausea, Rectal Pain and Vomiting. Female Genitourinary Not Present- Frequency, Nocturia, Painful Urination, Pelvic Pain and Urgency. Musculoskeletal Not Present- Back Pain, Joint Pain, Joint Stiffness, Muscle Pain, Muscle Weakness and Swelling of Extremities. Neurological Not Present- Decreased Memory, Fainting, Headaches, Numbness, Seizures, Tingling, Tremor, Trouble walking and Weakness. Psychiatric Not Present- Anxiety, Bipolar, Change in Sleep Pattern, Depression, Fearful and Frequent crying. Endocrine Not Present- Cold Intolerance, Excessive Hunger, Hair Changes, Heat Intolerance, Hot flashes and New Diabetes. Hematology Not Present- Blood Thinners, Easy Bruising, Excessive bleeding, Gland problems, HIV and Persistent Infections.   Physical Exam Rolm Bookbinder MD; 07/06/2016 12:28 PM) General Mental Status-Alert. Orientation-Oriented X3.  Chest and Lung Exam Chest and lung exam reveals -quiet, even and easy respiratory effort with no use of accessory muscles and on auscultation, normal breath sounds, no adventitious sounds and normal vocal resonance.  Breast Nipples-No Discharge. Note: left nipple retracted laterally    Cardiovascular Cardiovascular examination reveals -normal heart sounds, regular  rate and rhythm with no murmurs.  Lymphatic Head & Neck  General Head & Neck Lymphatics: Bilateral - Description - Normal. Axillary  General Axillary Region: Bilateral - Description - Normal. Note: no [place]  adenopathy     Assessment & Plan Rolm Bookbinder MD; 07/06/2016 12:39 PM) BREAST CANCER OF UPPER-OUTER QUADRANT OF LEFT FEMALE BREAST (C50.412) Story: Left total mastectomy, left axillary sn biopsy We discussed the staging and pathophysiology of breast cancer. We discussed all of the different options for treatment for breast cancer including surgery, chemotherapy, radiation therapy, Herceptin, and antiestrogen therapy. We discussed a sentinel lymph node biopsy as she does not appear to having lymph node involvement right now. We discussed the performance of that with injection of radioactive tracer. . We discussed that there is a chance of having a positive node with a sentinel lymph node biopsy and we will await the permanent pathology to make any other first further decisions in terms of her treatment. One of these options might be to return to the operating room to perform an axillary lymph node dissection. We discussed up to a 5% risk lifetime of chronic shoulder pain as well as lymphedema associated with a sentinel lymph node biopsy. We discussed the mastectomy (removal of whole breast) and the postoperative care for that as well. Mastectomy can be followed by reconstruction. She does not desire any reconstruction or a discussion with plastic surgery. This is a more extensive surgery and requires more recovery. Most mastectomy patients will not need radiation therapy. here is also no real difference between her recurrence in the breast. We discussed the risks of operation including bleeding, infection, possible reoperation. She understands her further therapy will be based on what her stages at the time of her operation.

## 2016-07-11 NOTE — Progress Notes (Signed)
Assisted Nuc med tech [phone] with nuc med inj  Side rails up, monitors on throughout procedure. See vital signs in flow sheet. Tolerated Procedure well.

## 2016-07-11 NOTE — Anesthesia Preprocedure Evaluation (Addendum)
Anesthesia Evaluation  Patient identified by MRN, date of birth, ID band  Reviewed: Allergy & Precautions, NPO status , Patient's Chart, lab work & pertinent test results  History of Anesthesia Complications Negative for: history of anesthetic complications  Airway Mallampati: II  TM Distance: >3 FB Neck ROM: Full    Dental  (+) Dental Advisory Given Poor dentition:   Pulmonary neg pulmonary ROS,    Pulmonary exam normal breath sounds clear to auscultation       Cardiovascular Exercise Tolerance: Good negative cardio ROS   Rhythm:Regular Rate:Normal     Neuro/Psych negative neurological ROS     GI/Hepatic negative GI ROS, Neg liver ROS,   Endo/Other  negative endocrine ROS  Renal/GU negative Renal ROS     Musculoskeletal negative musculoskeletal ROS (+)   Abdominal (+) - obese,   Peds  Hematology negative hematology ROS (+)   Anesthesia Other Findings Left breast cancer  Reproductive/Obstetrics negative OB ROS                            Anesthesia Physical Anesthesia Plan  ASA: II  Anesthesia Plan: General and Regional   Post-op Pain Management:  Regional for Post-op pain   Induction: Intravenous  Airway Management Planned: LMA  Additional Equipment:   Intra-op Plan:   Post-operative Plan: Extubation in OR  Informed Consent: I have reviewed the patients History and Physical, chart, labs and discussed the procedure including the risks, benefits and alternatives for the proposed anesthesia with the patient or authorized representative who has indicated his/her understanding and acceptance.   Dental advisory given  Plan Discussed with:   Anesthesia Plan Comments:       Anesthesia Quick Evaluation

## 2016-07-11 NOTE — Discharge Instructions (Signed)
[facility] °[phone] ° ° POST OP INSTRUCTIONS ° °Always review your discharge instruction sheet given to you by the facility where your surgery was performed. °IF YOU HAVE DISABILITY OR FAMILY LEAVE FORMS, YOU MUST BRING THEM TO THE OFFICE FOR PROCESSING.   °DO NOT GIVE THEM TO YOUR DOCTOR. °A prescription for pain medication may be given to you upon discharge.  Take your pain medication as prescribed, if needed.  If narcotic pain medicine is not needed, then you may take acetaminophen (Tylenol), naprosyn (Alleve) or ibuprofen (Advil) as needed. °1. Take your usually prescribed medications unless otherwise directed. °2. If you need a refill on your pain medication, please contact your pharmacy.  They will contact our office to request authorization.  Prescriptions will not be filled after 5pm or on week-ends. °3. You should follow a light diet the first few days after arrival home, such as soup and crackers, etc.  Resume your normal diet the day after surgery. °4. Most patients will experience some swelling and bruising on the chest and underarm.  Ice packs will help.  Swelling and bruising can take several days to resolve. Wear the binder day and night until you return to the office.  °5. It is common to experience some constipation if taking pain medication after surgery.  Increasing fluid intake and taking a stool softener (such as Colace) will usually help or prevent this problem from occurring.  A mild laxative (Milk of Magnesia or Miralax) should be taken according to package instructions if there are no bowel movements after 48 hours. °6. Unless discharge instructions indicate otherwise, leave your bandage dry and in place until your next appointment in 3-5 days.  You may take a limited sponge bath.  No tube baths or showers until the drains are removed.  You may have steri-strips (small skin tapes) in place directly over the incision.  These strips should be left on the skin for  7-10 days. If you have glue it will come off in next couple week.  Any sutures will be removed at an office visit °7. DRAINS:  If you have drains in place, it is important to keep a list of the amount of drainage produced each day in your drains.  Before leaving the hospital, you should be instructed on drain care.  Call our office if you have any questions about your drains. I will remove your drains when they put out less than 30 cc or ml for 2 consecutive days. °8. ACTIVITIES:  You may resume regular (light) daily activities beginning the next day--such as daily self-care, walking, climbing stairs--gradually increasing activities as tolerated.  You may have sexual intercourse when it is comfortable.  Refrain from any heavy lifting or straining until approved by your doctor. °a. You may drive when you are no longer taking prescription pain medication, you can comfortably wear a seatbelt, and you can safely maneuver your car and apply brakes. °b. RETURN TO WORK:  __________________________________________________________ °9. You should see your doctor in the office for a follow-up appointment approximately 3-5 days after your surgery.  Your doctor’s nurse will typically make your follow-up appointment when she calls you with your pathology report.  Expect your pathology report 3-4business days after surgery. °10. OTHER INSTRUCTIONS: ______________________________________________________________________________________________ ____________________________________________________________________________________________ °WHEN TO CALL YOUR DR Quindell Shere: °1. Fever over 101.0 °2. Nausea and/or vomiting °3. Extreme swelling or bruising °4. Continued bleeding from incision. °5. Increased pain, redness, or drainage from the incision. °The clinic staff is available   to answer your questions during regular business hours.  Please don’t hesitate to call and ask to speak to one of the nurses for clinical concerns.  If you have a  medical emergency, go to the nearest emergency room or call 911.  A surgeon from [facility] is always on call at the hospital. °[address], [address], [place], [place]  [address] ? [address] [address], [place], [place]   [address] °[phone] ? [phone] ? FAX [phone] °Web site: [email or web address] ° °

## 2016-07-11 NOTE — Op Note (Signed)
Preoperative diagnosis: left breast cancer, clinical stage II Postoperative diagnosis: same as above Procedure: Left total mastectomy, left axillary sn biopsy Surgeon: Dr Serita Grammes Anesthesia: general with pectoral block EBL: minimal Drains 19 Fr blake drain  Specimens:left mastectomy short superior, long lateral left axillary sn biopsy with count of [id number] Complications none Sponge and needle count correct dispo case turned over to plastics  Indications: This is a 31yof with newly found left breast cancer that I recommended mastectomy. She did not desire reconstruction.  I discussed mastectomy with sentinel node biopsy.   Procedure: After informed consent was obtained the patient was taken to the OR. She was given antibiotics and scds were in place. She had pec blocks. She was placed under general anesthesia without complication. She was prepped and draped in the standard sterile surgical fashion. A timeout was performed.  I made an elliptical incision that encompassed the nipple areolar complex and the palpable mass which was right beneath the skin. I then created flaps to the clavicle, parasternal area, lat and the IM fold.  The breast was then removed with the fascia from the muscle.  The breast was marked as above.. Hemostasis was obtained.. I then used the neoprobe to identify the axillary sentinel nodes. The highest count was as above. There was no real background radioactivity. Hemostasis was obtained.  I then placed a 19 Fr Blake drain in this space and secured this with a 2-0 nylon.  I then closed with 2-0 vicryl and 4-0 monocryl. I did place arista in the cavity.  Glue and steristrips were applied. She tolerated well and was transferred to recovery.

## 2016-07-11 NOTE — Anesthesia Procedure Notes (Signed)
Procedure Name: LMA Insertion Date/Time: 07/11/2016 2:51 PM Performed by: Iridian Reader D Pre-anesthesia Checklist: Patient identified, Emergency Drugs available, Suction available and Patient being monitored Patient Re-evaluated:Patient Re-evaluated prior to inductionOxygen Delivery Method: Circle system utilized Preoxygenation: Pre-oxygenation with 100% oxygen Intubation Type: IV induction Ventilation: Mask ventilation without difficulty LMA: LMA inserted LMA Size: 4.0 Number of attempts: 1 Airway Equipment and Method: Bite block Placement Confirmation: positive ETCO2 Tube secured with: Tape Dental Injury: Teeth and Oropharynx as per pre-operative assessment

## 2016-07-11 NOTE — Anesthesia Procedure Notes (Signed)
Anesthesia Regional Block:  Pectoralis block  Pre-Anesthetic Checklist: ,, timeout performed, Correct Patient, Correct Site, Correct Laterality, Correct Procedure, Correct Position, site marked, Risks and benefits discussed,  Surgical consent,  Pre-op evaluation,  At surgeon's request and post-op pain management  Laterality: Left  Prep: chloraprep       Needles:  Injection technique: Single-shot  Needle Type: Echogenic Stimulator Needle     Needle Length: 10cm 10 cm Needle Gauge: 21 and 21 G    Additional Needles:  Procedures: ultrasound guided (picture in chart) Pectoralis block Narrative:  Start time: 07/11/2016 1:40 PM End time: 07/11/2016 1:45 PM Injection made incrementally with aspirations every 5 mL.  Performed by: Personally  Anesthesiologist: Nilda Simmer

## 2016-07-11 NOTE — Progress Notes (Signed)
  Assisted {Dr Rosalyn Charters with left, ultrasound guided, pectoralis block. Side rails up, monitors on throughout procedure. See vital signs in flow sheet. Tolerated Procedure well.

## 2016-07-11 NOTE — Transfer of Care (Signed)
Immediate Anesthesia Transfer of Care Note  Patient: Stephanie Dennis  Procedure(s) Performed: Procedure(s): LEFT TOTAL MASTECTOMY WITH SENTINEL LYMPH NODE BIOPSY (Left)  Patient Location: PACU  Anesthesia Type:GA combined with regional for post-op pain  Level of Consciousness: awake and patient cooperative  Airway & Oxygen Therapy: Patient Spontanous Breathing and Patient connected to face mask oxygen  Post-op Assessment: Report given to RN and Post -op Vital signs reviewed and stable  Post vital signs: Reviewed and stable  Last Vitals:  Vitals:   07/11/16 1422 07/11/16 1547  BP:    Pulse: 75 (!) 56  Resp: 20 16  Temp:      Last Pain:  Vitals:   07/11/16 1258  TempSrc: Oral         Complications: No apparent anesthesia complications

## 2016-07-11 NOTE — Interval H&P Note (Signed)
History and Physical Interval Note:  07/11/2016 2:38 PM  Stephanie Dennis  has presented today for surgery, with the diagnosis of LEFT BREAST CANCER  The various methods of treatment have been discussed with the patient and family. After consideration of risks, benefits and other options for treatment, the patient has consented to  Procedure(s): LEFT TOTAL MASTECTOMY WITH SENTINEL LYMPH NODE BIOPSY (Left) as a surgical intervention .  The patient's history has been reviewed, patient examined, no change in status, stable for surgery.  I have reviewed the patient's chart and labs.  Questions were answered to the patient's satisfaction.     Karsten Howry

## 2016-07-12 ENCOUNTER — Telehealth: Payer: Self-pay | Admitting: *Deleted

## 2016-07-12 DIAGNOSIS — C50412 Malignant neoplasm of upper-outer quadrant of left female breast: Secondary | ICD-10-CM | POA: Diagnosis not present

## 2016-07-12 NOTE — Progress Notes (Signed)
Patient ID: [facility], female   DOB: 02-20-1966, 50 y.o.   MRN: [id number] Doing well, incision clean, no hematoma, drain as expected dc home today

## 2016-07-12 NOTE — Telephone Encounter (Signed)
Left message for a return phone call to follow up from [facility] 8/2.

## 2016-07-12 NOTE — Anesthesia Postprocedure Evaluation (Signed)
Anesthesia Post Note  Patient: Stephanie Dennis  Procedure(s) Performed: Procedure(s) (LRB): LEFT TOTAL MASTECTOMY WITH SENTINEL LYMPH NODE BIOPSY (Left)  Patient location during evaluation: PACU Anesthesia Type: General and Regional Level of consciousness: awake and alert Pain management: pain level controlled Vital Signs Assessment: post-procedure vital signs reviewed and stable Respiratory status: spontaneous breathing, nonlabored ventilation, respiratory function stable and patient connected to nasal cannula oxygen Cardiovascular status: blood pressure returned to baseline and stable Postop Assessment: no signs of nausea or vomiting Anesthetic complications: no    Last Vitals:  Vitals:   07/12/16 0411 07/12/16 0600  BP:  119/63  Pulse: (!) 58 71  Resp:  16  Temp:  36.6 C    Last Pain:  Vitals:   07/12/16 0830  TempSrc:   PainSc: 2                  Krrish Freund J

## 2016-07-13 ENCOUNTER — Encounter (HOSPITAL_BASED_OUTPATIENT_CLINIC_OR_DEPARTMENT_OTHER): Payer: Self-pay | Admitting: General Surgery

## 2016-07-13 ENCOUNTER — Telehealth: Payer: Self-pay | Admitting: Hematology and Oncology

## 2016-07-13 NOTE — Telephone Encounter (Signed)
Spoke with pt to confirm 8/14 appt at 1130 am per pof

## 2016-07-18 ENCOUNTER — Encounter: Payer: Self-pay | Admitting: *Deleted

## 2016-07-18 ENCOUNTER — Encounter: Payer: Self-pay | Admitting: Hematology and Oncology

## 2016-07-18 ENCOUNTER — Telehealth: Payer: Self-pay | Admitting: *Deleted

## 2016-07-18 ENCOUNTER — Ambulatory Visit (HOSPITAL_BASED_OUTPATIENT_CLINIC_OR_DEPARTMENT_OTHER): Payer: BLUE CROSS/BLUE SHIELD | Admitting: Hematology and Oncology

## 2016-07-18 DIAGNOSIS — C50412 Malignant neoplasm of upper-outer quadrant of left female breast: Secondary | ICD-10-CM

## 2016-07-18 NOTE — Progress Notes (Signed)
Patient Care Team: Merrilee Seashore, MD as PCP - General (Internal Medicine) Rolm Bookbinder, MD as Consulting Physician (General Surgery) Nicholas Lose, MD as Consulting Physician (Hematology and Oncology) Kyung Rudd, MD as Consulting Physician (Radiation Oncology)  DIAGNOSIS: Breast cancer of upper-outer quadrant of left female breast [facility])   Staging form: Breast, AJCC 7th Edition   - Clinical stage from 07/06/2016: Stage IIB (T3(m), N0, M0) - Signed by Nicholas Lose, MD on 07/06/2016  SUMMARY OF ONCOLOGIC HISTORY:   Breast cancer of upper-outer quadrant of left female breast ([place])   06/22/2016 Initial Diagnosis    Left breast biopsies subareolar: 3 biopsies showing multifocal invasive lobular carcinoma with LCIS grade 2 with lymphovascular invasion, ER 80%, PR 90%, Ki-67 3%, HER-2 negative ratio 1.13     07/05/2016 Breast MRI    Left breast biopsy 7:00: Grade 2 invasive ductal carcinoma, ER 95%, PR 95%, Ki-67 15%, HER-2 negative ratio 1.48; left breast distortion 1.2 x 1.2 x 0.8 cm; left breast cyst 1.1 cm, T1b N0 stage IA clinical stage     07/11/2016 Surgery    Left mastectomy: ILC grade 2, 6 cm, LCIS, 1/3 lymph nodes micrometastatic disease, ER 80%, PR 90%, HER-2 negative, Ki-67 3%, T3 N0 (i+) stage IIB pathologic stage      CHIEF COMPLIANT: Follow-up after recent mastectomy  INTERVAL HISTORY: Stephanie Dennis is a 50 year old with above-mentioned history of left breast cancer who underwent mastectomy and is here to discuss the pathology report. She is healing fairly well from the surgery. She denies any major discomfort or pain.   REVIEW OF SYSTEMS:   Constitutional: Denies fevers, chills or abnormal weight loss Eyes: Denies blurriness of vision Ears, nose, mouth, throat, and face: Denies mucositis or sore throat Respiratory: Denies cough, dyspnea or wheezes Cardiovascular: Denies palpitation, chest discomfort Gastrointestinal:  Denies nausea, heartburn or change in bowel  habits Skin: Denies abnormal skin rashes Lymphatics: Denies new lymphadenopathy or easy bruising Neurological:Denies numbness, tingling or new weaknesses Behavioral/Psych: Mood is stable, no new changes  Extremities: No lower extremity edema Breast: Recent left mastectomy All other systems were reviewed with the patient and are negative.  I have reviewed the past medical history, past surgical history, social history and family history with the patient and they are unchanged from previous note.  ALLERGIES:  has No Known Allergies.  MEDICATIONS:  Current Outpatient Prescriptions  Medication Sig Dispense Refill  . oxyCODONE (OXY IR/ROXICODONE) 5 MG immediate release tablet Take 1 tablet (5 mg total) by mouth every 6 (six) hours as needed for moderate pain, severe pain or breakthrough pain. 20 tablet 0   No current facility-administered medications for this visit.     PHYSICAL EXAMINATION: ECOG PERFORMANCE STATUS: 1 - Symptomatic but completely ambulatory  Vitals:   07/18/16 1125  BP: 128/80  Pulse: 65  Resp: 17  Temp: 98.5 F (36.9 C)   Filed Weights   07/18/16 1125  Weight: 129 lb 8 oz (58.7 kg)    GENERAL:alert, no distress and comfortable SKIN: skin color, texture, turgor are normal, no rashes or significant lesions EYES: normal, Conjunctiva are pink and non-injected, sclera clear OROPHARYNX:no exudate, no erythema and lips, buccal mucosa, and tongue normal  NECK: supple, thyroid normal size, non-tender, without nodularity LYMPH:  no palpable lymphadenopathy in the cervical, axillary or inguinal LUNGS: clear to auscultation and percussion with normal breathing effort HEART: regular rate & rhythm and no murmurs and no lower extremity edema ABDOMEN:abdomen soft, non-tender and normal bowel sounds MUSCULOSKELETAL:no  cyanosis of digits and no clubbing  NEURO: alert & oriented x 3 with fluent speech, no focal motor/sensory deficits EXTREMITIES: No lower extremity  edema  LABORATORY DATA:  I have reviewed the data as listed   Chemistry      Component Value Date/Time   NA 141 07/06/2016 0821   K 3.7 07/06/2016 0821   CO2 26 07/06/2016 0821   BUN 10.4 07/06/2016 0821   CREATININE 0.7 07/06/2016 0821      Component Value Date/Time   CALCIUM 9.6 07/06/2016 0821   ALKPHOS 55 07/06/2016 0821   AST 18 07/06/2016 0821   ALT 11 07/06/2016 0821   BILITOT 0.50 07/06/2016 0821       Lab Results  Component Value Date   WBC 4.4 07/06/2016   HGB 14.2 07/06/2016   HCT 42.5 07/06/2016   MCV 96.6 07/06/2016   PLT 210 07/06/2016   NEUTROABS 2.2 07/06/2016     ASSESSMENT & PLAN:  Breast cancer of upper-outer quadrant of left female breast ([place]) Left mastectomy 07/11/2016: ILC grade 2, 6 cm, LCIS, 1/3 lymph nodes micrometastatic disease, ER 80%, PR 90%, HER-2 negative, Ki-67 3%, T3 N0 (i+) stage IIB pathologic stage  Recommendations: 1. Oncotype DX testing to determine if chemotherapy would be of any benefit followed by 2. Adjuvant antiestrogen therapy (patient is eligible for PALLAS trial) But tamoxifen 20 mg daily 10 years or once she becomes menopausal then she will go on 5 years of aromatase inhibitor therapy.  Tamoxifen counseling:We discussed the risks and benefits of tamoxifen. These include but not limited to insomnia, hot flashes, mood changes, vaginal dryness, and weight gain. Although rare, serious side effects including endometrial cancer, risk of blood clots were also discussed. We strongly believe that the benefits far outweigh the risks. Patient understands these risks and consented to starting treatment. Planned treatment duration is 5 years.   Return to clinic based upon Oncotype DX score result. If the Oncotype is low risk, we will then send a prescription for tamoxifen to her pharmacy.   No orders of the defined types were placed in this encounter.  The patient has a good understanding of the overall plan. she agrees with it.  she will call with any problems that may develop before the next visit here.   Rulon Eisenmenger, MD 07/18/16

## 2016-07-18 NOTE — Assessment & Plan Note (Signed)
Left mastectomy 07/11/2016: ILC grade 2, 6 cm, LCIS, 1/3 lymph nodes micrometastatic disease, ER 80%, PR 90%, HER-2 negative, Ki-67 3%, T3 N0 (i+) stage IIB pathologic stage  Recommendations: 1. Oncotype DX testing to determine if chemotherapy would be of any benefit followed by 2. Adjuvant radiation therapy followed by 3. Adjuvant antiestrogen therapy (patient is eligible for PALLAS trial)  Return to clinic based upon Oncotype DX score result.

## 2016-07-18 NOTE — Telephone Encounter (Signed)
Ordered oncotype per Dr. Gudena.  Faxed requisition to pathology and confirmed receipt.  Faxed PA to [employer].  

## 2016-08-09 ENCOUNTER — Telehealth: Payer: Self-pay | Admitting: *Deleted

## 2016-08-09 NOTE — Telephone Encounter (Signed)
Pt called in question of when her oncotype score will be back. Informed pt that [employer] requested further information that was faxed on 8/28 and again today. Informed pt that as soon as her results were release we will call her with the score. Received verbal understanding. Contact information provided.

## 2016-08-10 ENCOUNTER — Other Ambulatory Visit: Payer: Self-pay | Admitting: *Deleted

## 2016-08-10 DIAGNOSIS — C50412 Malignant neoplasm of upper-outer quadrant of left female breast: Secondary | ICD-10-CM

## 2016-08-10 MED ORDER — TAMOXIFEN CITRATE 20 MG PO TABS: 20.0000 mg | ORAL_TABLET | Freq: Every day | ORAL | 6 refills | Status: DC

## 2016-08-11 ENCOUNTER — Telehealth: Payer: Self-pay | Admitting: *Deleted

## 2016-08-11 ENCOUNTER — Encounter (HOSPITAL_COMMUNITY): Payer: Self-pay

## 2016-08-11 NOTE — Telephone Encounter (Signed)
Spoke with patient to discuss oncotype score of 9.  She does not need chemo, but still may need XRT due to tumor size of 6cm.  Referral placed for Dr. Lisbeth Renshaw.  She will follow up with Dr. Lindi Adie after XRT complete.

## 2016-08-16 NOTE — Progress Notes (Signed)
Follow up Left Breast Cancer  Diagnosis 07/11/2016: 1. Breast, simple mastectomy, Left Total INVASIVE LOBULAR CARCINOMA, GRADE 2, SPANNING ABOUT 6.0 CM THE INVASIVE LOBULAR CARCINOMA IS FOCALLY WITHIN 1 MM OF THE CENTRAL DEEP MARGIN LOBULAR CARCINOMA IN SITU WITH CALCIFICATION IS PRESENT 2. Lymph node, sentinel, biopsy, Left Axillary #1 MICROMETASTATIC LOBULAR CARCINOMA (1 MM, PN0 I+) 3. Lymph node, sentinel, biopsy, Left ONE BENIGN LYMPH NODE (0/1) 4. Lymph node, sentinel, biopsy, Left ONE BENIGN LYMPH NODE (0/1)  Oncotype DX result score=9

## 2016-08-22 ENCOUNTER — Ambulatory Visit: Payer: BLUE CROSS/BLUE SHIELD | Attending: General Surgery | Admitting: Physical Therapy

## 2016-08-22 DIAGNOSIS — Z483 Aftercare following surgery for neoplasm: Secondary | ICD-10-CM | POA: Diagnosis not present

## 2016-08-22 NOTE — Therapy (Signed)
[place], [place], [address] Phone: [phone]   Fax:  [phone]  Physical Therapy Evaluation  Patient Details  Name: Stephanie Dennis MRN: [id number] Date of Birth: [birth year] Referring Provider: Dr. Rolm Bookbinder  Encounter Date: 08/22/2016      PT End of Session - 08/22/16 1253    Visit Number 1   Number of Visits 1   PT Start Time 0804   PT Stop Time 0845   PT Time Calculation (min) 41 min   Activity Tolerance Patient tolerated treatment well   Behavior During Therapy [facility] for tasks assessed/performed      Past Medical History:  Diagnosis Date  . Breast cancer of upper-outer quadrant of left female breast ([place]) 06/24/2016    Past Surgical History:  Procedure Laterality Date  . CESAREAN SECTION    . MASTECTOMY W/ SENTINEL NODE BIOPSY Left 07/11/2016   Procedure: LEFT TOTAL MASTECTOMY WITH SENTINEL LYMPH NODE BIOPSY;  Surgeon: Rolm Bookbinder, MD;  Location: [facility];  Service: General;  Laterality: Left;  . WISDOM TOOTH EXTRACTION      There were no vitals filed for this visit.       Subjective Assessment - 08/22/16 0809    Subjective Patient states she is here to follow up from her mastectomy. Says she isn't having any difficutly with anything at home.   Pertinent History Patient was diagnosed on 06/15/16 with left invasive lobular carcinoma.  It is ER/PR positive, HER2 negative with a Ki67 of 3%.  There are multiple masses totaling 7 cm. Patient is s/p left mastectomy on 07/11/16 with 3 lymph nodes removed.    Patient Stated Goals to follow up from her surgery   Currently in Pain? No/denies            [facility] PT Assessment - 08/22/16 0001      Assessment   Medical Diagnosis Left breast cancer   Referring Provider Dr. Rolm Bookbinder   Onset Date/Surgical Date 07/11/16   Hand Dominance Right     Restrictions   Weight Bearing Restrictions No     Balance  Screen   Has the patient fallen in the past 6 months No   Has the patient had a decrease in activity level because of a fear of falling?  No   Is the patient reluctant to leave their home because of a fear of falling?  No     Home Environment   Living Environment Private residence   Living Arrangements Children  one son   Type of [place] to enter   Entrance Stairs-Number of Steps 3   [place] Two level   Additional Comments --     Prior Function   Level of Independence Independent   Vocation Full time employment   Leisure walked 4 days per week up until surgery (about a mile or 2)     Cognition   Overall Cognitive Status Within Functional Limits for tasks assessed     Posture/Postural Control   Posture/Postural Control Postural limitations   Postural Limitations Rounded Shoulders;Forward head     AROM   Left Shoulder Flexion 148 Degrees   Left Shoulder ABduction 135 Degrees   Left Shoulder Internal Rotation 73 Degrees  in supine   Left Shoulder External Rotation 64 Degrees  in supine     Ambulation/Gait   Ambulation/Gait Yes   Ambulation/Gait Assistance 7: Independent  LYMPHEDEMA/ONCOLOGY QUESTIONNAIRE - 08/22/16 0819      Left Upper Extremity Lymphedema   10 cm Proximal to Olecranon Process 26.2 cm   Olecranon Process 24.6 cm   10 cm Proximal to Ulnar Styloid Process 20 cm   Just Proximal to Ulnar Styloid Process 14.6 cm   Across Hand at [employer] 18.3 cm   At [place] of 2nd Digit 5.5 cm           Quick Dash - 08/22/16 0001    Open a tight or new jar No difficulty   Do heavy household chores (wash walls, wash floors) No difficulty   Carry a shopping bag or briefcase No difficulty   Wash your back No difficulty   Use a knife to cut food No difficulty   Recreational activities in which you take some force or impact through your arm, shoulder, or hand (golf, hammering, tennis) No difficulty   During the past week,  to what extent has your arm, shoulder or hand problem interfered with your normal social activities with family, friends, neighbors, or groups? Not at all   During the past week, to what extent has your arm, shoulder or hand problem limited your work or other regular daily activities Not at all   Arm, shoulder, or hand pain. None   Tingling (pins and needles) in your arm, shoulder, or hand None   Difficulty Sleeping No difficulty   DASH Score 0 %                     PT Education - 08/22/16 1252    Education provided Yes   Education Details After Breast Cancer packet; performance of scar mobilization at home   Person(s) Educated Patient   Methods Explanation;Demonstration   Comprehension Verbalized understanding              Breast Clinic Goals - 07/06/16 1305      Patient will be able to verbalize understanding of pertinent lymphedema risk reduction practices relevant to her diagnosis specifically related to skin care.   Time 1   Period Days   Status Achieved     Patient will be able to return demonstrate and/or verbalize understanding of the post-op home exercise program related to regaining shoulder range of motion.   Time 1   Period Days   Status Achieved     Patient will be able to verbalize understanding of the importance of attending the postoperative After Breast Cancer Class for further lymphedema risk reduction education and therapeutic exercise.   Time 1   Period Days   Status Achieved              Plan - 08/22/16 1254    Clinical Impression Statement Patient is s/p left mastectomy with removal of 3 lymph nodes on 07/11/16. She will meet with the radiation oncologist later this week to discuss the possibility of radiation. She has been working on exercises that her doctor gave her at home. She demonstrates good bilateral shoulder AROM and reports she has no difficulty with anything at home. Therapist educated her on lymphedema risk reduction, HEP  for UE ROM and strengthening, and was provided with information for the After Breast Cancer class.   PT Frequency One time visit   PT Treatment/Interventions Patient/family education;Therapeutic exercise   PT Next Visit Plan Has no additional needs for therapy at this time   Consulted and Agree with Plan of Care Patient      Patient  will benefit from skilled therapeutic intervention in order to improve the following deficits and impairments:  Decreased range of motion  Visit Diagnosis: No diagnosis found.     Problem List Patient Active Problem List   Diagnosis Date Noted  . Breast cancer, female, left 07/11/2016  . Breast cancer of upper-outer quadrant of left female breast ([place]) 06/24/2016    Mellody Life 08/22/2016, 1:01 PM  [place], [place], [address] Phone: [phone]   Fax:  [phone]  Name: ANJOLIE MAJER MRN: [id number] Date of Birth: 07/07/1966   Saverio Danker, SPT  This entire session was guided, instructed, and directly supervised by Serafina Royals, PT.  Read, reviewed, edited and agree with student's findings and recommendations.   Serafina Royals, PT 08/22/16 2:23 PM

## 2016-08-24 ENCOUNTER — Ambulatory Visit
Admission: RE | Admit: 2016-08-24 | Discharge: 2016-08-24 | Disposition: A | Discharge status: Home / Self Care | Payer: BLUE CROSS/BLUE SHIELD | Source: Ambulatory Visit | Attending: Radiation Oncology | Admitting: Radiation Oncology

## 2016-08-24 ENCOUNTER — Encounter: Payer: Self-pay | Admitting: Radiation Oncology

## 2016-08-24 DIAGNOSIS — Z510 Encounter for antineoplastic radiation therapy: Secondary | ICD-10-CM | POA: Insufficient documentation

## 2016-08-24 DIAGNOSIS — C50412 Malignant neoplasm of upper-outer quadrant of left female breast: Secondary | ICD-10-CM | POA: Diagnosis not present

## 2016-08-24 HISTORY — DX: Malignant neoplasm of unspecified site of unspecified female breast: C50.919

## 2016-08-24 NOTE — Progress Notes (Addendum)
Ms. Stephanie Dennis here for reassessment since her initial consult in breast clinic.  She denies any pain in her left breast at this time.  Complete ROM left arm.   Location of Breast Cancer:Left Breast  Histology per Pathology Report:  07/11/16 Diagnosis 1. Breast, simple mastectomy, Left Total INVASIVE LOBULAR CARCINOMA, GRADE 2, SPANNING ABOUT 6.0 CM THE INVASIVE LOBULAR CARCINOMA IS FOCALLY WITHIN 1 MM OF THE CENTRAL DEEP MARGIN LOBULAR CARCINOMA IN SITU WITH CALCIFICATION IS PRESENT 2. Lymph node, sentinel, biopsy, Left Axillary #1 MICROMETASTATIC LOBULAR CARCINOMA (1 MM, PN0 I+) 3. Lymph node, sentinel, biopsy, Left ONE BENIGN LYMPH NODE (0/1) 4. Lymph node, sentinel, biopsy, Left ONE BENIGN LYMPH NODE (0/1)   Receptor Status: ER(80%), PR (90%), Her2-neu (Neg), Ki-(3%)  Did patient present with symptoms (if so, please note symptoms) or was this found on screening mammography?:Ms. Stephanie Dennis stated she felt a lump in her left breast, followed by a mammogram  Past/Anticipated interventions by surgeon, if FEO:FHQR Breast Mastectomy  Past/Anticipated interventions by medical oncology, if any: Chemotherapy: No Chemotherapy.  Oncotype Dx 9%  Lymphedema issues, if any: None  Pain issues, if any: No  SAFETY ISSUES:  Prior radiation? No  Pacemaker/ICD? No  Possible current pregnancy?No  Is the patient on methotrexate? No  Current Complaints / other details:   Menses age 72, BC x 5 years, G1, P74 at age 25, last menstrual cycle in July 2017  Mother Breast Cancer age 69    Darrian Grzelak, Crista Curb, RN 08/24/2016,8:37 AM

## 2016-08-24 NOTE — Progress Notes (Signed)
Radiation Oncology         (336) [phone] ________________________________  Name: Stephanie Dennis MRN: [id number]  Date: 08/24/2016  DOB: 03/07/1966  SG:AWJXIOELXIYH,YZZFR, MD  Serena Croissant, MD     REFERRING PHYSICIAN: Serena Croissant, MD   DIAGNOSIS: The encounter diagnosis was Breast cancer of upper-outer quadrant of left female breast (HCC).  Breast cancer of upper-outer quadrant of left female breast [facility])   Staging form: Breast, AJCC 7th Edition   - Clinical stage from 07/06/2016: Stage IIB (T3(m), N0, M0) - Signed by Serena Croissant, MD on 07/06/2016   HISTORY OF PRESENT ILLNESS: Stephanie Dennis 50 y.o. female with recent diagnosis of left breast cancer. She had a screening mammogram which did not show any abnormalities but noted lumps in her breasts. She was found on diagnostic imaging to have multiple breast nodules measuring 2.9 cm and 1:00 position, 0.6 cm at 1:00 position and 1.4 cm at 12:30 position. She also had what appeared to be fibroadenomas and right and left breast. Ultrasound-guided biopsy was performed which revealed invasive lobular carcinoma with lobular carcinoma in situ grade 2 along with lymphovascular invasion that was ER/PR positive HER-2 negative with a Ki-67 of 3%. She underwent a breast MRI which revealed a 5.5 cm central mass with multiple satellite nodules the overall span was 7.2 cm multicentric disease. It was abutting the skin. She was presented in the multidisciplinary tumor board and was counseled on mastectomy with sentinel node evaluation. She was taken to the operating room on 07/11/16 where she underwent a simple left mastectomy with sentinel node assessment. Final pathology revealed invasive lobular carcinoma, grade 2 spanning 6 cm, and focally within 1 mm of the central deep margin. The three nodes which were removed were negative for malignancy. She had an oncotype score postoperatively of 9, indicating a low yield for chemotherapy.   She comes today to discuss  beginning post-mastectomy radiotherapy.   PREVIOUS RADIATION THERAPY: No   PAST MEDICAL HISTORY: Past Medical History:  Diagnosis Date  . Breast cancer (HCC)   . Breast cancer of upper-outer quadrant of left female breast (HCC) 06/24/2016     PAST SURGICAL HISTORY: Past Surgical History:  Procedure Laterality Date  . CESAREAN SECTION    . MASTECTOMY W/ SENTINEL NODE BIOPSY Left 07/11/2016   Procedure: LEFT TOTAL MASTECTOMY WITH SENTINEL LYMPH NODE BIOPSY;  Surgeon: Emelia Loron, MD;  Location: [facility];  Service: General;  Laterality: Left;  . WISDOM TOOTH EXTRACTION       FAMILY HISTORY:  Family History  Problem Relation Age of Onset  . Breast cancer Mother   . Prostate cancer Father     SOCIAL HISTORY:  reports that she has never smoked. She has never used smokeless tobacco. She reports that she drinks about 0.6 - 1.2 oz of alcohol per week . She reports that she does not use drugs. 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   ALLERGIES: Review of patient's allergies indicates no known allergies.   MEDICATIONS:  Current Outpatient Prescriptions  Medication Sig Dispense Refill  . oxyCODONE (OXY IR/ROXICODONE) 5 MG immediate release tablet Take 1 tablet (5 mg total) by mouth every 6 (six) hours as needed for moderate pain, severe pain or breakthrough pain. (Patient not taking: Reported on 08/24/2016) 20 tablet 0  . tamoxifen (NOLVADEX) 20 MG tablet Take 1 tablet (20 mg total) by mouth daily. (Patient not taking: Reported on 08/24/2016) 30 tablet 6   No current facility-administered  medications for this encounter.      REVIEW OF SYSTEMS:  On review of systems, the patient reports that she is doing well overall. She has an appointment on Monday to work with physical therapy on maintaining her range of motion and states that she has some soreness in axilla but no limitation or contracture. She denies any pain at her mastectomy site. She denies any  chest pain, shortness of breath, cough, fevers, chills, night sweats, unintended weight changes. She denies any bowel or bladder disturbances, and denies abdominal pain, nausea or vomiting. She denies any new musculoskeletal or joint aches or pains. A complete review of systems is obtained and is otherwise negative.     PHYSICAL EXAM:  height is '5\' 7"'$  (1.702 m) and weight is 135 lb 12.8 oz (61.6 kg). Her oral temperature is 98 F (36.7 C). Her blood pressure is 127/83 and her pulse is 98.   In general this is a well appearing caucasian female in no acute distress. She's alert and oriented x4 and appropriate throughout the examination. Cardiopulmonary assessment is negative for acute distress and she exhibits normal effort. The left mastectomy site is evaluated. The incision is well healed without cellulitic change, separation or bleeding. No lymphedema is noted of the LUE or of the chest wall.   ECOG = 0  0 - Asymptomatic (Fully active, able to carry on all predisease activities without restriction)  1 - Symptomatic but completely ambulatory (Restricted in physically strenuous activity but ambulatory and able to carry out work of a light or sedentary nature. For example, light housework, office work)  2 - Symptomatic, <50% in bed during the day (Ambulatory and capable of all self care but unable to carry out any work activities. Up and about more than 50% of waking hours)  3 - Symptomatic, >50% in bed, but not bedbound (Capable of only limited self-care, confined to bed or chair 50% or more of waking hours)  4 - Bedbound (Completely disabled. Cannot carry on any self-care. Totally confined to bed or chair)  5 - Death   Eustace Pen MM, Creech RH, Tormey DC, et al. [phone]). "Toxicity and response criteria of the [facility]". [place] Oncol. 5 (6): 649-55      LABORATORY DATA:  Lab Results  Component Value Date   WBC 4.4 07/06/2016   HGB 14.2 07/06/2016   HCT 42.5  07/06/2016   MCV 96.6 07/06/2016   PLT 210 07/06/2016   Lab Results  Component Value Date   NA 141 07/06/2016   K 3.7 07/06/2016   CO2 26 07/06/2016   Lab Results  Component Value Date   ALT 11 07/06/2016   AST 18 07/06/2016   ALKPHOS 55 07/06/2016   BILITOT 0.50 07/06/2016      RADIOGRAPHY: No results found.     IMPRESSION/PLAN: 1. Stage IIB, T3, N0 ER/PR positive multifocal invasive lobular carcinoma of the left breast. Dr. Lisbeth Renshaw has discussed the role of adjuvant radiation treatment in this setting when he met with her in the multidisciplinary clinic. We discussed the potential benefit of radiation treatment, especially with regards to local control of the patient's tumor. We also reviewed and discussed the possible side effects and risks of such a treatment as well. He has recommended 33 fractions over 6 1/2 weeks of radiotherapy to the chest wall, including supraclavicular region. We will move forward with her simulation on Friday of this week. Written consent is obtained and placed in her chart.  In a visit lasting 30 minutes, greater than 50% of the time was spent reviewing the side effects, and risks of radiotherapy.    Carola Rhine, PAC

## 2016-08-26 ENCOUNTER — Ambulatory Visit
Admission: RE | Admit: 2016-08-26 | Discharge: 2016-08-26 | Disposition: A | Discharge status: Home / Self Care | Payer: BLUE CROSS/BLUE SHIELD | Source: Ambulatory Visit | Attending: Radiation Oncology | Admitting: Radiation Oncology

## 2016-08-26 DIAGNOSIS — Z510 Encounter for antineoplastic radiation therapy: Secondary | ICD-10-CM | POA: Diagnosis not present

## 2016-08-26 DIAGNOSIS — C50412 Malignant neoplasm of upper-outer quadrant of left female breast: Secondary | ICD-10-CM

## 2016-08-30 ENCOUNTER — Ambulatory Visit
Admission: RE | Admit: 2016-08-30 | Discharge: 2016-08-30 | Disposition: A | Discharge status: Home / Self Care | Payer: BLUE CROSS/BLUE SHIELD | Source: Ambulatory Visit | Attending: Radiation Oncology | Admitting: Radiation Oncology

## 2016-09-02 ENCOUNTER — Ambulatory Visit
Admission: RE | Admit: 2016-09-02 | Discharge: 2016-09-02 | Disposition: A | Discharge status: Home / Self Care | Payer: BLUE CROSS/BLUE SHIELD | Source: Ambulatory Visit | Attending: Radiation Oncology | Admitting: Radiation Oncology

## 2016-09-02 ENCOUNTER — Telehealth: Payer: Self-pay | Admitting: Hematology and Oncology

## 2016-09-02 DIAGNOSIS — Z510 Encounter for antineoplastic radiation therapy: Secondary | ICD-10-CM | POA: Diagnosis not present

## 2016-09-02 NOTE — Progress Notes (Signed)
  Radiation Oncology         [phone]) [phone] ________________________________  Name: Stephanie Dennis MRN: [id number]  Date: 08/26/2016  DOB: 1966-06-26  Optical Surface Tracking Plan:  Since intensity modulated radiotherapy (IMRT) and 3D conformal radiation treatment methods are predicated on accurate and precise positioning for treatment, intrafraction motion monitoring is medically necessary to ensure accurate and safe treatment delivery.  The ability to quantify intrafraction motion without excessive ionizing radiation dose can only be performed with optical surface tracking. Accordingly, surface imaging offers the opportunity to obtain 3D measurements of patient position throughout IMRT and 3D treatments without excessive radiation exposure.  I am ordering optical surface tracking for this patient's upcoming course of radiotherapy. ________________________________  Kyung Rudd, MD 09/02/2016 8:42 AM    Reference:   Particia Jasper, et al. Surface imaging-based analysis of intrafraction motion for breast radiotherapy patients.Journal of [place], n. 6, nov. 2014. ISSN [id number].   Available at: <http://www.jacmp.org/index.php/jacmp/article/view/4957>.

## 2016-09-02 NOTE — Telephone Encounter (Signed)
lvm to inform pt of 11/13 appt per LOS

## 2016-09-02 NOTE — Progress Notes (Signed)
  Radiation Oncology         (336) [phone] ________________________________  Name: Stephanie Dennis MRN: [id number]  Date: 08/26/2016  DOB: [birth year]  Diagnosis DIAGNOSIS:     ICD-9-CM ICD-10-CM   1. Breast cancer of upper-outer quadrant of left female breast ([place]) 174.4 C50.412      SIMULATION AND TREATMENT PLANNING NOTE  The patient presented for simulation prior to beginning her course of radiation treatment for her diagnosis of left-sided breast cancer. The patient was placed in a supine position on a breast board. A customized vac-lock bag was also constructed and this complex treatment device will be used on a daily basis during her treatment. In this fashion, a CT scan was obtained through the chest area and an isocenter was placed near the chest wall at the upper aspect of the right chest. A breath-hold technique has also been evaluated to determine if this significantly improves the spatial relationship between the target region and the heart. Based on this analysis, a breath-hold technique has been ordered for the patient's treatment.  The patient will be planned to receive a course of radiation initially to a dose of 50.4 gray. This will consist of a 4 field technique targeting the left chest wall as well as the supraclavicular region. Therefore 2 customized medial and lateral tangent fields have been created targeting the chest wall, and also 2 additional customized fields have been designed to treat the supraclavicular region both with a left supraclavicular field and a left posterior axillary boost field. A forward planning/reduced field technique will also be evaluated to determine if this significantly improves the dose homogeneity of the overall plan. Therefore, additional customized blocks/fields may be necessary.  This initial treatment will be accomplished at 1.8 gray per fraction.   The initial plan will consist of a 3-D conformal technique. The target volume/scar, heart and  lungs have been contoured and dose volume histograms of each of these structures will be evaluated as part of the 3-D conformal treatment planning process.   It is anticipated that the patient will then receive a 10 gray boost to the surgical scar. This will be accomplished at 2 gray per fraction. The final anticipated total dose therefore will correspond to 60.4 gray.   Special treatment procedure was performed today due to the extra time and effort required by myself to plan and prepare this patient for deep inspiration breath hold technique.  I have determined cardiac sparing to be of benefit to this patient to prevent long term cardiac damage due to radiation of the heart.  Bellows were placed on the patient's abdomen. To facilitate cardiac sparing, the patient was coached by the radiation therapists on breath hold techniques and breathing practice was performed. Practice waveforms were obtained. The patient was then scanned while maintaining breath hold in the treatment position.  This image was then transferred over to the imaging specialist. The imaging specialist then created a fusion of the free breathing and breath hold scans using the chest wall as the stable structure. I personally reviewed the fusion in axial, coronal and sagittal image planes.  Excellent cardiac sparing was obtained.  I felt the patient is an appropriate candidate for breath hold and the patient will be treated as such.  The image fusion was then reviewed with the patient to reinforce the necessity of reproducible breath hold.      _______________________________   Jodelle Gross, MD, PhD

## 2016-09-05 ENCOUNTER — Ambulatory Visit
Admission: RE | Admit: 2016-09-05 | Discharge: 2016-09-05 | Disposition: A | Discharge status: Home / Self Care | Payer: BLUE CROSS/BLUE SHIELD | Source: Ambulatory Visit | Attending: Radiation Oncology | Admitting: Radiation Oncology

## 2016-09-05 DIAGNOSIS — Z510 Encounter for antineoplastic radiation therapy: Secondary | ICD-10-CM | POA: Diagnosis not present

## 2016-09-06 ENCOUNTER — Ambulatory Visit
Admission: RE | Admit: 2016-09-06 | Discharge: 2016-09-06 | Disposition: A | Discharge status: Home / Self Care | Payer: BLUE CROSS/BLUE SHIELD | Source: Ambulatory Visit | Attending: Radiation Oncology | Admitting: Radiation Oncology

## 2016-09-06 ENCOUNTER — Ambulatory Visit: Payer: BLUE CROSS/BLUE SHIELD

## 2016-09-06 DIAGNOSIS — Z510 Encounter for antineoplastic radiation therapy: Secondary | ICD-10-CM | POA: Diagnosis not present

## 2016-09-06 DIAGNOSIS — C50412 Malignant neoplasm of upper-outer quadrant of left female breast: Secondary | ICD-10-CM

## 2016-09-06 DIAGNOSIS — Z170 Estrogen receptor positive status [ER+]: Principal | ICD-10-CM

## 2016-09-06 MED ORDER — RADIAPLEXRX EX GEL
Freq: Once | CUTANEOUS | Status: AC
Start: 2016-09-06 — End: 2016-09-06
  Administered 2016-09-06: 09:00:00 via TOPICAL

## 2016-09-06 MED ORDER — ALRA NON-METALLIC DEODORANT (RAD-ONC)
1.0000 | Freq: Once | TOPICAL | Status: AC
Start: 2016-09-06 — End: 2016-09-06
  Administered 2016-09-06: 1 via TOPICAL

## 2016-09-06 NOTE — Progress Notes (Signed)
Patient education done, My business card, Radiation therapy and you book, alra deodorant,.radiaplex gel cream given to the patient,, skin irritation,pain, swelling of breast/soreness,fatigue, use of electric shaver for under arm, no underwire bras, use of dove soap unscented recommended,  Increase protein in diet, stay hydrated,,verbal understanding, teach back given 8:51 AM

## 2016-09-07 ENCOUNTER — Ambulatory Visit
Admission: RE | Admit: 2016-09-07 | Discharge: 2016-09-07 | Disposition: A | Discharge status: Home / Self Care | Payer: BLUE CROSS/BLUE SHIELD | Source: Ambulatory Visit | Attending: Radiation Oncology | Admitting: Radiation Oncology

## 2016-09-07 DIAGNOSIS — Z510 Encounter for antineoplastic radiation therapy: Secondary | ICD-10-CM | POA: Diagnosis not present

## 2016-09-08 ENCOUNTER — Ambulatory Visit
Admission: RE | Admit: 2016-09-08 | Discharge: 2016-09-08 | Disposition: A | Discharge status: Home / Self Care | Payer: BLUE CROSS/BLUE SHIELD | Source: Ambulatory Visit | Attending: Radiation Oncology | Admitting: Radiation Oncology

## 2016-09-08 DIAGNOSIS — Z510 Encounter for antineoplastic radiation therapy: Secondary | ICD-10-CM | POA: Diagnosis not present

## 2016-09-09 ENCOUNTER — Ambulatory Visit: Payer: BLUE CROSS/BLUE SHIELD | Attending: Radiation Oncology | Admitting: Radiation Oncology

## 2016-09-09 ENCOUNTER — Ambulatory Visit: Payer: BLUE CROSS/BLUE SHIELD

## 2016-09-09 ENCOUNTER — Encounter: Payer: Self-pay | Admitting: Radiation Oncology

## 2016-09-12 ENCOUNTER — Encounter: Payer: Self-pay | Admitting: Radiation Oncology

## 2016-09-12 ENCOUNTER — Ambulatory Visit
Admission: RE | Admit: 2016-09-12 | Discharge: 2016-09-12 | Disposition: A | Discharge status: Home / Self Care | Payer: BLUE CROSS/BLUE SHIELD | Source: Ambulatory Visit | Attending: Radiation Oncology | Admitting: Radiation Oncology

## 2016-09-12 VITALS — BP 122/77 | HR 79 | Temp 98.6°F | Ht 67.0 in | Wt 135.2 lb

## 2016-09-12 DIAGNOSIS — C50412 Malignant neoplasm of upper-outer quadrant of left female breast: Secondary | ICD-10-CM

## 2016-09-12 DIAGNOSIS — Z510 Encounter for antineoplastic radiation therapy: Secondary | ICD-10-CM | POA: Diagnosis not present

## 2016-09-12 DIAGNOSIS — Z170 Estrogen receptor positive status [ER+]: Principal | ICD-10-CM

## 2016-09-12 NOTE — Progress Notes (Signed)
Stephanie Dennis has received 5 fractions to her left chest wall.  She notes that she has mild swelling at the incisional region near her axillary region.  No other voiced concerns.  BP 122/77   Pulse 79   Temp 98.6 F (37 C) (Oral)   Ht 5\' 7"  (1.702 m)   Wt 135 lb 3.2 oz (61.3 kg)   BMI 21.18 kg/m

## 2016-09-12 NOTE — Progress Notes (Signed)
   Department of Radiation Oncology  Phone:  [phone] Fax:        [phone]  Weekly Treatment Note    Name: Stephanie Dennis Date: 09/12/2016 MRN: [id number] DOB: 13-Jun-1966   Diagnosis:     ICD-9-CM ICD-10-CM   1. Malignant neoplasm of upper-outer quadrant of left breast in female, estrogen receptor positive ([place]) 174.4 C50.412    V86.0 Z17.0      Current dose: 9 Gy  Current fraction:5   MEDICATIONS: Current Outpatient Prescriptions  Medication Sig Dispense Refill  . hyaluronate sodium (RADIAPLEXRX) GEL Apply 1 application topically 2 (two) times daily.    . non-metallic deodorant Jethro Poling) MISC Apply 1 application topically daily as needed.    Marland Kitchen oxyCODONE (OXY IR/ROXICODONE) 5 MG immediate release tablet Take 1 tablet (5 mg total) by mouth every 6 (six) hours as needed for moderate pain, severe pain or breakthrough pain. (Patient not taking: Reported on 09/12/2016) 20 tablet 0  . tamoxifen (NOLVADEX) 20 MG tablet Take 1 tablet (20 mg total) by mouth daily. (Patient not taking: Reported on 09/12/2016) 30 tablet 6   No current facility-administered medications for this encounter.      ALLERGIES: Review of patient's allergies indicates no known allergies.   LABORATORY DATA:  Lab Results  Component Value Date   WBC 4.4 07/06/2016   HGB 14.2 07/06/2016   HCT 42.5 07/06/2016   MCV 96.6 07/06/2016   PLT 210 07/06/2016   Lab Results  Component Value Date   NA 141 07/06/2016   K 3.7 07/06/2016   CO2 26 07/06/2016   Lab Results  Component Value Date   ALT 11 07/06/2016   AST 18 07/06/2016   ALKPHOS 55 07/06/2016   BILITOT 0.50 07/06/2016     NARRATIVE: Stephanie Dennis  was seen today for weekly treatment management. The chart was checked and the patient's films were reviewed.  Stephanie Dennis has received 5 fractions to her left chest wall.  She notes that she has mild swelling at the incisional region near her axillary region.  No other voiced concerns.  PHYSICAL  EXAMINATION: height is 5\' 7"  (1.702 m) and weight is 135 lb 3.2 oz (61.3 kg). Her oral temperature is 98.6 F (37 C). Her blood pressure is 122/77 and her pulse is 79.   Breast exam deferred.  ASSESSMENT: The patient is doing satisfactorily with treatment.  PLAN: We will continue with the patient's radiation treatment as planned.  ------------------------------------------------  Stephanie Gross, MD, PhD  This document serves as a record of services personally performed by Stephanie Rudd, MD. It was created on his behalf by Stephanie Dennis, a trained medical scribe. The creation of this record is based on the scribe's personal observations and the provider's statements to them. This document has been checked and approved by the attending provider.

## 2016-09-13 ENCOUNTER — Ambulatory Visit
Admission: RE | Admit: 2016-09-13 | Discharge: 2016-09-13 | Disposition: A | Discharge status: Home / Self Care | Payer: BLUE CROSS/BLUE SHIELD | Source: Ambulatory Visit | Attending: Radiation Oncology | Admitting: Radiation Oncology

## 2016-09-13 DIAGNOSIS — Z510 Encounter for antineoplastic radiation therapy: Secondary | ICD-10-CM | POA: Diagnosis not present

## 2016-09-14 ENCOUNTER — Ambulatory Visit
Admission: RE | Admit: 2016-09-14 | Discharge: 2016-09-14 | Disposition: A | Discharge status: Home / Self Care | Payer: BLUE CROSS/BLUE SHIELD | Source: Ambulatory Visit | Attending: Radiation Oncology | Admitting: Radiation Oncology

## 2016-09-14 DIAGNOSIS — Z510 Encounter for antineoplastic radiation therapy: Secondary | ICD-10-CM | POA: Diagnosis not present

## 2016-09-15 ENCOUNTER — Ambulatory Visit
Admission: RE | Admit: 2016-09-15 | Discharge: 2016-09-15 | Disposition: A | Discharge status: Home / Self Care | Payer: BLUE CROSS/BLUE SHIELD | Source: Ambulatory Visit | Attending: Radiation Oncology | Admitting: Radiation Oncology

## 2016-09-15 ENCOUNTER — Encounter: Payer: Self-pay | Admitting: Radiation Oncology

## 2016-09-15 VITALS — BP 125/91 | HR 60 | Temp 98.3°F | Ht 67.0 in | Wt 135.6 lb

## 2016-09-15 DIAGNOSIS — C50412 Malignant neoplasm of upper-outer quadrant of left female breast: Secondary | ICD-10-CM

## 2016-09-15 DIAGNOSIS — Z510 Encounter for antineoplastic radiation therapy: Secondary | ICD-10-CM | POA: Diagnosis not present

## 2016-09-15 DIAGNOSIS — Z170 Estrogen receptor positive status [ER+]: Principal | ICD-10-CM

## 2016-09-15 NOTE — Progress Notes (Signed)
   Department of Radiation Oncology  Phone:  [phone] Fax:        [phone]  Weekly Treatment Note    Name: Stephanie Dennis Date: 09/15/2016 MRN: [id number] DOB: [birth year]   Diagnosis:     ICD-9-CM ICD-10-CM   1. Malignant neoplasm of upper-outer quadrant of left breast in female, estrogen receptor positive ([place]) 174.4 C50.412    V86.0 Z17.0      Current dose: 14.4 Gy  Current fraction: 8   MEDICATIONS: Current Outpatient Prescriptions  Medication Sig Dispense Refill  . hyaluronate sodium (RADIAPLEXRX) GEL Apply 1 application topically 2 (two) times daily.    . non-metallic deodorant Jethro Poling) MISC Apply 1 application topically daily as needed.    Marland Kitchen oxyCODONE (OXY IR/ROXICODONE) 5 MG immediate release tablet Take 1 tablet (5 mg total) by mouth every 6 (six) hours as needed for moderate pain, severe pain or breakthrough pain. (Patient not taking: Reported on 09/15/2016) 20 tablet 0  . tamoxifen (NOLVADEX) 20 MG tablet Take 1 tablet (20 mg total) by mouth daily. (Patient not taking: Reported on 09/15/2016) 30 tablet 6   No current facility-administered medications for this encounter.      ALLERGIES: Review of patient's allergies indicates no known allergies.   LABORATORY DATA:  Lab Results  Component Value Date   WBC 4.4 07/06/2016   HGB 14.2 07/06/2016   HCT 42.5 07/06/2016   MCV 96.6 07/06/2016   PLT 210 07/06/2016   Lab Results  Component Value Date   NA 141 07/06/2016   K 3.7 07/06/2016   CO2 26 07/06/2016   Lab Results  Component Value Date   ALT 11 07/06/2016   AST 18 07/06/2016   ALKPHOS 55 07/06/2016   BILITOT 0.50 07/06/2016     NARRATIVE: Stephanie Dennis was seen today for weekly treatment management. The chart was checked and the patient's films were reviewed.  Stephanie Dennis is here for her 8th fraction of radiation to her Left Chest wall, Lt Sclav. She denies pain, or fatigue. Her Left Chest Wall is normal appearing. She is using radiaplex  twice daily as directed.   BP (!) 125/91   Pulse 60   Temp 98.3 F (36.8 C)   Ht 5\' 7"  (1.702 m)   Wt 135 lb 9.6 oz (61.5 kg)   SpO2 100% Comment: room air  BMI 21.24 kg/m    Wt Readings from Last 3 Encounters:  09/15/16 135 lb 9.6 oz (61.5 kg)  09/12/16 135 lb 3.2 oz (61.3 kg)  08/24/16 135 lb 12.8 oz (61.6 kg)    PHYSICAL EXAMINATION: height is 5\' 7"  (1.702 m) and weight is 135 lb 9.6 oz (61.5 kg). Her temperature is 98.3 F (36.8 C). Her blood pressure is 125/91 (abnormal) and her pulse is 60. Her oxygen saturation is 100%.      Mild erythema present in the treatment area  ASSESSMENT: The patient is doing satisfactorily with treatment.  PLAN: We will continue with the patient's radiation treatment as planned.

## 2016-09-15 NOTE — Progress Notes (Signed)
Stephanie Dennis is here for her 8th fraction of radiation to her Left Chest wall, Lt Sclav. She denies pain, or fatigue. Her Left Chest Wall is normal appearing. She is using radiaplex twice daily as directed.   BP (!) 125/91   Pulse 60   Temp 98.3 F (36.8 C)   Ht 5\' 7"  (1.702 m)   Wt 135 lb 9.6 oz (61.5 kg)   SpO2 100% Comment: room air  BMI 21.24 kg/m    Wt Readings from Last 3 Encounters:  09/15/16 135 lb 9.6 oz (61.5 kg)  09/12/16 135 lb 3.2 oz (61.3 kg)  08/24/16 135 lb 12.8 oz (61.6 kg)

## 2016-09-16 ENCOUNTER — Ambulatory Visit
Admission: RE | Admit: 2016-09-16 | Discharge: 2016-09-16 | Disposition: A | Discharge status: Home / Self Care | Payer: BLUE CROSS/BLUE SHIELD | Source: Ambulatory Visit | Attending: Radiation Oncology | Admitting: Radiation Oncology

## 2016-09-16 DIAGNOSIS — Z510 Encounter for antineoplastic radiation therapy: Secondary | ICD-10-CM | POA: Diagnosis not present

## 2016-09-19 ENCOUNTER — Ambulatory Visit
Admission: RE | Admit: 2016-09-19 | Discharge: 2016-09-19 | Disposition: A | Discharge status: Home / Self Care | Payer: BLUE CROSS/BLUE SHIELD | Source: Ambulatory Visit | Attending: Radiation Oncology | Admitting: Radiation Oncology

## 2016-09-19 DIAGNOSIS — Z510 Encounter for antineoplastic radiation therapy: Secondary | ICD-10-CM | POA: Diagnosis not present

## 2016-09-20 ENCOUNTER — Ambulatory Visit
Admission: RE | Admit: 2016-09-20 | Discharge: 2016-09-20 | Disposition: A | Discharge status: Home / Self Care | Payer: BLUE CROSS/BLUE SHIELD | Source: Ambulatory Visit | Attending: Radiation Oncology | Admitting: Radiation Oncology

## 2016-09-20 DIAGNOSIS — Z510 Encounter for antineoplastic radiation therapy: Secondary | ICD-10-CM | POA: Diagnosis not present

## 2016-09-21 ENCOUNTER — Encounter: Payer: Self-pay | Admitting: Radiation Oncology

## 2016-09-21 ENCOUNTER — Ambulatory Visit
Admission: RE | Admit: 2016-09-21 | Discharge: 2016-09-21 | Disposition: A | Discharge status: Home / Self Care | Payer: BLUE CROSS/BLUE SHIELD | Source: Ambulatory Visit | Attending: Radiation Oncology | Admitting: Radiation Oncology

## 2016-09-21 VITALS — BP 124/69 | HR 64 | Temp 98.1°F | Resp 16 | Ht 67.0 in | Wt 136.6 lb

## 2016-09-21 DIAGNOSIS — L299 Pruritus, unspecified: Secondary | ICD-10-CM | POA: Insufficient documentation

## 2016-09-21 DIAGNOSIS — Z171 Estrogen receptor negative status [ER-]: Secondary | ICD-10-CM

## 2016-09-21 DIAGNOSIS — C50412 Malignant neoplasm of upper-outer quadrant of left female breast: Secondary | ICD-10-CM

## 2016-09-21 DIAGNOSIS — Z510 Encounter for antineoplastic radiation therapy: Secondary | ICD-10-CM | POA: Insufficient documentation

## 2016-09-21 MED ORDER — RADIAPLEXRX EX GEL
Freq: Once | CUTANEOUS | Status: AC
  Administered 2016-09-21: 09:00:00 via TOPICAL

## 2016-09-21 NOTE — Progress Notes (Signed)
Stephanie Dennis is here for her 12th fraction of radiation to her Left Chest wall, Lt Sclav. She denies pain, or fatigue. Her Left Chest Wall is red. She is using radiaplex twice daily as directed.  Appetite is good.  Denies fatigue. Wt Readings from Last 3 Encounters:  09/21/16 136 lb 9.6 oz (62 kg)  09/15/16 135 lb 9.6 oz (61.5 kg)  09/12/16 135 lb 3.2 oz (61.3 kg)  BP 124/69 (BP Location: Right Arm, Patient Position: Sitting, Cuff Size: Normal)   Pulse 64   Temp 98.1 F (36.7 C) (Oral)   Resp 16   Ht 5\' 7"  (1.702 m)   Wt 136 lb 9.6 oz (62 kg)   LMP 06/07/2016   SpO2 100%   BMI 21.39 kg/m

## 2016-09-21 NOTE — Progress Notes (Signed)
   Department of Radiation Oncology  Phone:  [phone] Fax:        [phone]  Weekly Treatment Note    Name: Stephanie Dennis Date: 09/21/2016 MRN: [id number] DOB: [birth year]   Diagnosis:     ICD-9-CM ICD-10-CM   1. Malignant neoplasm of upper-outer quadrant of left breast in female, estrogen receptor negative (HCC) 174.4 C50.412 hyaluronate sodium (RADIAPLEXRX) gel   V86.1 Z17.1      Current dose: 21.6 Gy  Current fraction: 12   MEDICATIONS: Current Outpatient Prescriptions  Medication Sig Dispense Refill  . hyaluronate sodium (RADIAPLEXRX) GEL Apply 1 application topically 2 (two) times daily.    . non-metallic deodorant Jethro Poling) MISC Apply 1 application topically daily as needed.    Marland Kitchen oxyCODONE (OXY IR/ROXICODONE) 5 MG immediate release tablet Take 1 tablet (5 mg total) by mouth every 6 (six) hours as needed for moderate pain, severe pain or breakthrough pain. (Patient not taking: Reported on 09/21/2016) 20 tablet 0  . tamoxifen (NOLVADEX) 20 MG tablet Take 1 tablet (20 mg total) by mouth daily. (Patient not taking: Reported on 09/21/2016) 30 tablet 6   No current facility-administered medications for this encounter.      ALLERGIES: Review of patient's allergies indicates no known allergies.   LABORATORY DATA:  Lab Results  Component Value Date   WBC 4.4 07/06/2016   HGB 14.2 07/06/2016   HCT 42.5 07/06/2016   MCV 96.6 07/06/2016   PLT 210 07/06/2016   Lab Results  Component Value Date   NA 141 07/06/2016   K 3.7 07/06/2016   CO2 26 07/06/2016   Lab Results  Component Value Date   ALT 11 07/06/2016   AST 18 07/06/2016   ALKPHOS 55 07/06/2016   BILITOT 0.50 07/06/2016     NARRATIVE: [name] Vesper was seen today for weekly treatment management. The chart was checked and the patient's films were reviewed.  Ms. Kinley is here for her 12th fraction of radiation to her Left Chest wall, Lt Sclav. She reports pruritus in the treatment area. She denies  pain or fatigue. Her Left Chest Wall is red. She is using radiaplex twice daily as directed.  Appetite is good.  BP 124/69 (BP Location: Right Arm, Patient Position: Sitting, Cuff Size: Normal)   Pulse 64   Temp 98.1 F (36.7 C) (Oral)   Resp 16   Ht 5\' 7"  (1.702 m)   Wt 136 lb 9.6 oz (62 kg)   LMP 06/07/2016   SpO2 100%   BMI 21.39 kg/m    Wt Readings from Last 3 Encounters:  09/21/16 136 lb 9.6 oz (62 kg)  09/15/16 135 lb 9.6 oz (61.5 kg)  09/12/16 135 lb 3.2 oz (61.3 kg)    PHYSICAL EXAMINATION: height is 5\' 7"  (1.702 m) and weight is 136 lb 9.6 oz (62 kg). Her oral temperature is 98.1 F (36.7 C). Her blood pressure is 124/69 and her pulse is 64. Her respiration is 16 and oxygen saturation is 100%.  Hyperpigmentation in the left chest wall, supraclavicular, and axillary region.  ASSESSMENT: The patient is doing satisfactorily with treatment. We discussed hydrocortisone for the pruritus.  PLAN: We will continue with the patient's radiation treatment as planned.   ------------------------------------------------ ------------------------------------------------  Jodelle Gross, MD, PhD

## 2016-09-22 ENCOUNTER — Ambulatory Visit
Admission: RE | Admit: 2016-09-22 | Discharge: 2016-09-22 | Disposition: A | Discharge status: Home / Self Care | Payer: BLUE CROSS/BLUE SHIELD | Source: Ambulatory Visit | Attending: Radiation Oncology | Admitting: Radiation Oncology

## 2016-09-22 DIAGNOSIS — Z510 Encounter for antineoplastic radiation therapy: Secondary | ICD-10-CM | POA: Diagnosis not present

## 2016-09-23 ENCOUNTER — Ambulatory Visit
Admission: RE | Admit: 2016-09-23 | Discharge: 2016-09-23 | Disposition: A | Discharge status: Home / Self Care | Payer: BLUE CROSS/BLUE SHIELD | Source: Ambulatory Visit | Attending: Radiation Oncology | Admitting: Radiation Oncology

## 2016-09-23 ENCOUNTER — Encounter: Payer: Self-pay | Admitting: Radiation Oncology

## 2016-09-23 DIAGNOSIS — Z510 Encounter for antineoplastic radiation therapy: Secondary | ICD-10-CM | POA: Diagnosis not present

## 2016-09-26 ENCOUNTER — Ambulatory Visit
Admission: RE | Admit: 2016-09-26 | Discharge: 2016-09-26 | Disposition: A | Discharge status: Home / Self Care | Payer: BLUE CROSS/BLUE SHIELD | Source: Ambulatory Visit | Attending: Radiation Oncology | Admitting: Radiation Oncology

## 2016-09-26 DIAGNOSIS — Z510 Encounter for antineoplastic radiation therapy: Secondary | ICD-10-CM | POA: Diagnosis not present

## 2016-09-27 ENCOUNTER — Ambulatory Visit
Admission: RE | Admit: 2016-09-27 | Discharge: 2016-09-27 | Disposition: A | Discharge status: Home / Self Care | Payer: BLUE CROSS/BLUE SHIELD | Source: Ambulatory Visit | Attending: Radiation Oncology | Admitting: Radiation Oncology

## 2016-09-27 DIAGNOSIS — Z510 Encounter for antineoplastic radiation therapy: Secondary | ICD-10-CM | POA: Diagnosis not present

## 2016-09-28 ENCOUNTER — Ambulatory Visit
Admission: RE | Admit: 2016-09-28 | Discharge: 2016-09-28 | Disposition: A | Discharge status: Home / Self Care | Payer: BLUE CROSS/BLUE SHIELD | Source: Ambulatory Visit | Attending: Radiation Oncology | Admitting: Radiation Oncology

## 2016-09-28 DIAGNOSIS — Z510 Encounter for antineoplastic radiation therapy: Secondary | ICD-10-CM | POA: Diagnosis not present

## 2016-09-29 ENCOUNTER — Ambulatory Visit
Admission: RE | Admit: 2016-09-29 | Discharge: 2016-09-29 | Disposition: A | Discharge status: Home / Self Care | Payer: BLUE CROSS/BLUE SHIELD | Source: Ambulatory Visit | Attending: Radiation Oncology | Admitting: Radiation Oncology

## 2016-09-29 DIAGNOSIS — Z510 Encounter for antineoplastic radiation therapy: Secondary | ICD-10-CM | POA: Diagnosis not present

## 2016-09-30 ENCOUNTER — Encounter: Payer: Self-pay | Admitting: Radiation Oncology

## 2016-09-30 ENCOUNTER — Ambulatory Visit
Admission: RE | Admit: 2016-09-30 | Discharge: 2016-09-30 | Disposition: A | Discharge status: Home / Self Care | Payer: BLUE CROSS/BLUE SHIELD | Source: Ambulatory Visit | Attending: Radiation Oncology | Admitting: Radiation Oncology

## 2016-09-30 VITALS — BP 124/88 | HR 71 | Temp 97.8°F | Resp 18 | Ht 67.0 in | Wt 138.2 lb

## 2016-09-30 DIAGNOSIS — Z79899 Other long term (current) drug therapy: Secondary | ICD-10-CM | POA: Insufficient documentation

## 2016-09-30 DIAGNOSIS — C50412 Malignant neoplasm of upper-outer quadrant of left female breast: Secondary | ICD-10-CM

## 2016-09-30 DIAGNOSIS — Z510 Encounter for antineoplastic radiation therapy: Secondary | ICD-10-CM | POA: Diagnosis not present

## 2016-09-30 DIAGNOSIS — Z923 Personal history of irradiation: Secondary | ICD-10-CM | POA: Insufficient documentation

## 2016-09-30 DIAGNOSIS — Z008 Encounter for other general examination: Secondary | ICD-10-CM | POA: Diagnosis present

## 2016-09-30 MED ORDER — SONAFINE EX EMUL
1.0000 "application " | Freq: Two times a day (BID) | CUTANEOUS | Status: DC
  Administered 2016-09-30: 1 via TOPICAL

## 2016-09-30 NOTE — Progress Notes (Signed)
Stephanie Dennis is here for her 19th fraction of radiation to her Left Chest wall, Lt Sclav. She denies pain, or fatigue. Her Left Chest Wall is red with itching skin intact.   She is using radiaplex twice daily as directed.  Appetite is good.  Wt Readings from Last 3 Encounters:  09/30/16 138 lb 3.2 oz (62.7 kg)  09/21/16 136 lb 9.6 oz (62 kg)  09/15/16 135 lb 9.6 oz (61.5 kg)  BP 124/88   Pulse 71   Temp 97.8 F (36.6 C) (Oral)   Resp 18   Ht 5\' 7"  (1.702 m)   Wt 138 lb 3.2 oz (62.7 kg)   LMP 06/07/2016   SpO2 100%   BMI 21.65 kg/m

## 2016-09-30 NOTE — Progress Notes (Signed)
Department of Radiation Oncology  Phone:  [phone] Fax:        [phone]  Weekly Treatment Note    Name: Stephanie Dennis Date: 09/30/2016 MRN: [id number] DOB: 07-14-1966   Diagnosis:   No diagnosis found.   Current dose: 34.2 Gy  Current fraction: 19   MEDICATIONS: Current Outpatient Prescriptions  Medication Sig Dispense Refill  . hyaluronate sodium (RADIAPLEXRX) GEL Apply 1 application topically 2 (two) times daily.    . non-metallic deodorant Stephanie Dennis) MISC Apply 1 application topically daily as needed.    Marland Kitchen oxyCODONE (OXY IR/ROXICODONE) 5 MG immediate release tablet Take 1 tablet (5 mg total) by mouth every 6 (six) hours as needed for moderate pain, severe pain or breakthrough pain. (Patient not taking: Reported on 09/30/2016) 20 tablet 0  . tamoxifen (NOLVADEX) 20 MG tablet Take 1 tablet (20 mg total) by mouth daily. (Patient not taking: Reported on 09/30/2016) 30 tablet 6   No current facility-administered medications for this encounter.      ALLERGIES: Review of patient's allergies indicates no known allergies.   LABORATORY DATA:  Lab Results  Component Value Date   WBC 4.4 07/06/2016   HGB 14.2 07/06/2016   HCT 42.5 07/06/2016   MCV 96.6 07/06/2016   PLT 210 07/06/2016   Lab Results  Component Value Date   NA 141 07/06/2016   K 3.7 07/06/2016   CO2 26 07/06/2016   Lab Results  Component Value Date   ALT 11 07/06/2016   AST 18 07/06/2016   ALKPHOS 55 07/06/2016   BILITOT 0.50 07/06/2016     NARRATIVE: Stephanie Dennis was seen today for weekly treatment management. The chart was checked and the patient's films were reviewed.  Stephanie Dennis is here for her 19th fraction of radiation to her Left Chest wall, Lt Sclav. She denies pain or fatigue. Her Left Chest Wall is red with itchy skin, intact. She is using radiaplex twice daily as directed. She just purchased hydrocortisone creme last night. Appetite is good.  BP 124/88   Pulse 71   Temp 97.8  F (36.6 C) (Oral)   Resp 18   Ht 5\' 7"  (1.702 m)   Wt 138 lb 3.2 oz (62.7 kg)   LMP 06/07/2016   SpO2 100%   BMI 21.65 kg/m    Wt Readings from Last 3 Encounters:  09/30/16 138 lb 3.2 oz (62.7 kg)  09/21/16 136 lb 9.6 oz (62 kg)  09/15/16 135 lb 9.6 oz (61.5 kg)    PHYSICAL EXAMINATION: height is 5\' 7"  (1.702 m) and weight is 138 lb 3.2 oz (62.7 kg). Her oral temperature is 97.8 F (36.6 C). Her blood pressure is 124/88 and her pulse is 71. Her respiration is 18 and oxygen saturation is 100%.  Diffuse erythema with hyperpigmentation in the left chest wall, supraclavicular, and axillary region. No significant desquamation currently.  ASSESSMENT: The patient is doing satisfactorily with treatment. We will provided Sonafine for the pruritus and hydrocortisone as needed above it.  PLAN: We will continue with the patient's radiation treatment as planned.   ------------------------------------------------ ------------------------------------------------  Stephanie Gross, MD, PhD   This document serves as a record of services personally performed by Kyung Rudd, MD. It was created on his behalf by Darcus Austin, a trained medical scribe. The creation of this record is based on the scribe's personal observations and the provider's statements to them. This document has been checked and approved by the attending provider.

## 2016-10-03 ENCOUNTER — Ambulatory Visit
Admission: RE | Admit: 2016-10-03 | Discharge: 2016-10-03 | Disposition: A | Discharge status: Home / Self Care | Payer: BLUE CROSS/BLUE SHIELD | Source: Ambulatory Visit | Attending: Radiation Oncology | Admitting: Radiation Oncology

## 2016-10-03 DIAGNOSIS — Z510 Encounter for antineoplastic radiation therapy: Secondary | ICD-10-CM | POA: Diagnosis not present

## 2016-10-04 ENCOUNTER — Ambulatory Visit
Admission: RE | Admit: 2016-10-04 | Discharge: 2016-10-04 | Disposition: A | Discharge status: Home / Self Care | Payer: BLUE CROSS/BLUE SHIELD | Source: Ambulatory Visit | Attending: Radiation Oncology | Admitting: Radiation Oncology

## 2016-10-04 ENCOUNTER — Encounter: Payer: Self-pay | Admitting: Radiation Oncology

## 2016-10-04 DIAGNOSIS — Z510 Encounter for antineoplastic radiation therapy: Secondary | ICD-10-CM | POA: Diagnosis not present

## 2016-10-05 ENCOUNTER — Ambulatory Visit
Admission: RE | Admit: 2016-10-05 | Discharge: 2016-10-05 | Disposition: A | Discharge status: Home / Self Care | Payer: BLUE CROSS/BLUE SHIELD | Source: Ambulatory Visit | Attending: Radiation Oncology | Admitting: Radiation Oncology

## 2016-10-05 DIAGNOSIS — Z510 Encounter for antineoplastic radiation therapy: Secondary | ICD-10-CM | POA: Diagnosis not present

## 2016-10-06 ENCOUNTER — Ambulatory Visit
Admission: RE | Admit: 2016-10-06 | Discharge: 2016-10-06 | Disposition: A | Discharge status: Home / Self Care | Payer: BLUE CROSS/BLUE SHIELD | Source: Ambulatory Visit | Attending: Radiation Oncology | Admitting: Radiation Oncology

## 2016-10-06 DIAGNOSIS — Z510 Encounter for antineoplastic radiation therapy: Secondary | ICD-10-CM | POA: Diagnosis not present

## 2016-10-07 ENCOUNTER — Encounter: Payer: Self-pay | Admitting: Radiation Oncology

## 2016-10-07 ENCOUNTER — Ambulatory Visit
Admission: RE | Admit: 2016-10-07 | Discharge: 2016-10-07 | Disposition: A | Discharge status: Home / Self Care | Payer: BLUE CROSS/BLUE SHIELD | Source: Ambulatory Visit | Attending: Radiation Oncology | Admitting: Radiation Oncology

## 2016-10-07 VITALS — BP 333/72 | HR 59 | Temp 98.7°F | Resp 18 | Ht 67.0 in | Wt 137.2 lb

## 2016-10-07 DIAGNOSIS — Z923 Personal history of irradiation: Secondary | ICD-10-CM | POA: Insufficient documentation

## 2016-10-07 DIAGNOSIS — Z170 Estrogen receptor positive status [ER+]: Secondary | ICD-10-CM | POA: Diagnosis present

## 2016-10-07 DIAGNOSIS — C50412 Malignant neoplasm of upper-outer quadrant of left female breast: Secondary | ICD-10-CM | POA: Insufficient documentation

## 2016-10-07 DIAGNOSIS — Z79899 Other long term (current) drug therapy: Secondary | ICD-10-CM | POA: Insufficient documentation

## 2016-10-07 DIAGNOSIS — Z510 Encounter for antineoplastic radiation therapy: Secondary | ICD-10-CM | POA: Diagnosis not present

## 2016-10-07 MED ORDER — SONAFINE EX EMUL
1.0000 "application " | Freq: Two times a day (BID) | CUTANEOUS | Status: DC
  Administered 2016-10-07: 1 via TOPICAL

## 2016-10-07 NOTE — Progress Notes (Signed)
Department of Radiation Oncology  Phone:  [phone] Fax:        [phone]  Weekly Treatment Note    Name: Stephanie Dennis Date: 10/07/2016 MRN: [id number] DOB: 1966/08/06   Diagnosis:     ICD-9-CM ICD-10-CM   1. Malignant neoplasm of upper-outer quadrant of left breast in female, estrogen receptor positive ([place]) 174.4 C50.412 SONAFINE emulsion 1 application   [id number]      Current dose: 43.2 Gy  Current fraction: 24   MEDICATIONS: Current Outpatient Prescriptions  Medication Sig Dispense Refill  . non-metallic deodorant (ALRA) MISC Apply 1 application topically daily as needed.    . Wound Dressings (SONAFINE) Apply 1 application topically 2 (two) times daily.    Marland Kitchen oxyCODONE (OXY IR/ROXICODONE) 5 MG immediate release tablet Take 1 tablet (5 mg total) by mouth every 6 (six) hours as needed for moderate pain, severe pain or breakthrough pain. (Patient not taking: Reported on 10/07/2016) 20 tablet 0  . tamoxifen (NOLVADEX) 20 MG tablet Take 1 tablet (20 mg total) by mouth daily. (Patient not taking: Reported on 10/07/2016) 30 tablet 6   Current Facility-Administered Medications  Medication Dose Route Frequency Provider Last Rate Last Dose  . SONAFINE emulsion 1 application  1 application Topical BID Hayden Pedro, PA-C   1 application at [id number]     ALLERGIES: Review of patient's allergies indicates no known allergies.   LABORATORY DATA:  Lab Results  Component Value Date   WBC 4.4 07/06/2016   HGB 14.2 07/06/2016   HCT 42.5 07/06/2016   MCV 96.6 07/06/2016   PLT 210 07/06/2016   Lab Results  Component Value Date   NA 141 07/06/2016   K 3.7 07/06/2016   CO2 26 07/06/2016   Lab Results  Component Value Date   ALT 11 07/06/2016   AST 18 07/06/2016   ALKPHOS 55 07/06/2016   BILITOT 0.50 07/06/2016     NARRATIVE: Stephanie Dennis was seen today for weekly treatment management. The chart was checked and the patient's films were  reviewed.  Stephanie Dennis is here for her 24th fraction of radiation to her Left Chest wall, Lt Sclav. She denies pain or fatigue. Her Left Chest Wall, upper chest, and upper left shoulder are red. Hyperpigmentation with itching; skin intact. She is using Sonafine since last week twice daily as directed. Good appetite.  BP (!) 333/72   Pulse (!) 59   Temp 98.7 F (37.1 C) (Oral)   Resp 18   Ht 5\' 7"  (1.702 m)   Wt 137 lb 3.2 oz (62.2 kg)   LMP 06/07/2016   SpO2 100%   BMI 21.49 kg/m    Wt Readings from Last 3 Encounters:  10/07/16 137 lb 3.2 oz (62.2 kg)  09/30/16 138 lb 3.2 oz (62.7 kg)  09/21/16 136 lb 9.6 oz (62 kg)    PHYSICAL EXAMINATION: height is 5\' 7"  (1.702 m) and weight is 137 lb 3.2 oz (62.2 kg). Her oral temperature is 98.7 F (37.1 C). Her blood pressure is 333/72 (abnormal) and her pulse is 59 (abnormal). Her respiration is 18 and oxygen saturation is 100%.  Diffuse fairly bright erythema in the treatment area without moist desquamation.  ASSESSMENT: The patient is doing satisfactorily with treatment.  PLAN: We will continue with the patient's radiation treatment as planned. Another tube of Sonafine was provided for the patient. ------------------------------------------------  Jodelle Gross, MD, PhD   This document serves as a record of services personally performed  by Kyung Rudd, MD. It was created on his behalf by Darcus Austin, a trained medical scribe. The creation of this record is based on the scribe's personal observations and the provider's statements to them. This document has been checked and approved by the attending provider.

## 2016-10-07 NOTE — Progress Notes (Signed)
Stephanie Dennis is here for her 24 th fraction of radiation to her Left Chest wall, Lt Sclav. She denies pain, or fatigue. Her Left Chest Wall and upper chest and upper left shoulder is red, hyperpigmentation with itching skin intact.   She is using Sonafine since last week twice daily as directed. Appetite is good.  Wt Readings from Last 3 Encounters:  10/07/16 137 lb 3.2 oz (62.2 kg)  09/30/16 138 lb 3.2 oz (62.7 kg)  09/21/16 136 lb 9.6 oz (62 kg)  BP (!) 333/72   Pulse (!) 59   Temp 98.7 F (37.1 C) (Oral)   Resp 18   Ht 5\' 7"  (1.702 m)   Wt 137 lb 3.2 oz (62.2 kg)   LMP 06/07/2016   SpO2 100%   BMI 21.49 kg/m

## 2016-10-10 ENCOUNTER — Ambulatory Visit
Admission: RE | Admit: 2016-10-10 | Discharge: 2016-10-10 | Disposition: A | Discharge status: Home / Self Care | Payer: BLUE CROSS/BLUE SHIELD | Source: Ambulatory Visit | Attending: Radiation Oncology | Admitting: Radiation Oncology

## 2016-10-10 ENCOUNTER — Ambulatory Visit: Payer: BLUE CROSS/BLUE SHIELD | Admitting: Radiation Oncology

## 2016-10-10 DIAGNOSIS — Z510 Encounter for antineoplastic radiation therapy: Secondary | ICD-10-CM | POA: Diagnosis not present

## 2016-10-11 ENCOUNTER — Ambulatory Visit
Admission: RE | Admit: 2016-10-11 | Discharge: 2016-10-11 | Disposition: A | Discharge status: Home / Self Care | Payer: BLUE CROSS/BLUE SHIELD | Source: Ambulatory Visit | Attending: Radiation Oncology | Admitting: Radiation Oncology

## 2016-10-11 DIAGNOSIS — Z510 Encounter for antineoplastic radiation therapy: Secondary | ICD-10-CM | POA: Diagnosis not present

## 2016-10-12 ENCOUNTER — Ambulatory Visit
Admission: RE | Admit: 2016-10-12 | Discharge: 2016-10-12 | Disposition: A | Discharge status: Home / Self Care | Payer: BLUE CROSS/BLUE SHIELD | Source: Ambulatory Visit | Attending: Radiation Oncology | Admitting: Radiation Oncology

## 2016-10-12 DIAGNOSIS — Z170 Estrogen receptor positive status [ER+]: Principal | ICD-10-CM

## 2016-10-12 DIAGNOSIS — Z510 Encounter for antineoplastic radiation therapy: Secondary | ICD-10-CM | POA: Diagnosis not present

## 2016-10-12 DIAGNOSIS — C50412 Malignant neoplasm of upper-outer quadrant of left female breast: Secondary | ICD-10-CM

## 2016-10-12 NOTE — Progress Notes (Signed)
   Department of Radiation Oncology  Phone:  [phone] Fax:        [phone]  Weekly Treatment Note    Name: Stephanie Dennis Date: 10/13/2016 MRN: [id number] DOB: [birth year]   Diagnosis:     ICD-9-CM ICD-10-CM   1. Malignant neoplasm of upper-outer quadrant of left breast in female, estrogen receptor positive ([place]) 174.4 C50.412    V86.0 Z17.0      Current dose: 48.6 Gy  Current fraction: 27   MEDICATIONS: Current Outpatient Prescriptions  Medication Sig Dispense Refill  . non-metallic deodorant (ALRA) MISC Apply 1 application topically daily as needed.    Marland Kitchen oxyCODONE (OXY IR/ROXICODONE) 5 MG immediate release tablet Take 1 tablet (5 mg total) by mouth every 6 (six) hours as needed for moderate pain, severe pain or breakthrough pain. (Patient not taking: Reported on 10/07/2016) 20 tablet 0  . tamoxifen (NOLVADEX) 20 MG tablet Take 1 tablet (20 mg total) by mouth daily. (Patient not taking: Reported on 10/07/2016) 30 tablet 6  . Wound Dressings (SONAFINE) Apply 1 application topically 2 (two) times daily.     No current facility-administered medications for this encounter.      ALLERGIES: Patient has no known allergies.   LABORATORY DATA:  Lab Results  Component Value Date   WBC 4.4 07/06/2016   HGB 14.2 07/06/2016   HCT 42.5 07/06/2016   MCV 96.6 07/06/2016   PLT 210 07/06/2016   Lab Results  Component Value Date   NA 141 07/06/2016   K 3.7 07/06/2016   CO2 26 07/06/2016   Lab Results  Component Value Date   ALT 11 07/06/2016   AST 18 07/06/2016   ALKPHOS 55 07/06/2016   BILITOT 0.50 07/06/2016     NARRATIVE: [name] [place] was seen today for weekly treatment management. The chart was checked and the patient's films were reviewed.  The patient was seen in the treatment area. Patient clinically has been stable. Moderate skin irritation without any significant change.  PHYSICAL EXAMINATION: vitals were not taken for this visit.     She has prominent  hyperpigmentation in the treatment area with dry desquamation  ASSESSMENT: The patient is doing satisfactorily with treatment.  PLAN: We will continue with the patient's radiation treatment as planned.    ------------------------------------------------  Jodelle Gross, MD, PhD  This document serves as a record of services personally performed by Kyung Rudd, MD. It was created on his behalf by Arlyce Harman, a trained medical scribe. The creation of this record is based on the scribe's personal observations and the provider's statements to them. This document has been checked and approved by the attending provider.

## 2016-10-13 ENCOUNTER — Ambulatory Visit: Payer: BLUE CROSS/BLUE SHIELD

## 2016-10-13 DIAGNOSIS — Z510 Encounter for antineoplastic radiation therapy: Secondary | ICD-10-CM | POA: Diagnosis not present

## 2016-10-14 ENCOUNTER — Ambulatory Visit: Payer: BLUE CROSS/BLUE SHIELD

## 2016-10-14 DIAGNOSIS — Z510 Encounter for antineoplastic radiation therapy: Secondary | ICD-10-CM | POA: Diagnosis not present

## 2016-10-15 NOTE — Assessment & Plan Note (Signed)
Left mastectomy 07/11/2016: ILC grade 2, 6 cm, LCIS, 1/3 lymph nodes micrometastatic disease, ER 80%, PR 90%, HER-2 negative, Ki-67 3%, T3 N0 (i+) stage IIB pathologic stage Oncotype DX: 6, 7% ROR  Treatment Plan: Adjuvant antiestrogen therapy (patient is eligible for PALLAS trial) But tamoxifen 20 mg daily 10 years or once she becomes menopausal then she will go on 5 years of aromatase inhibitor therapy.  Tamoxifen Toxicities:  RTC in 6 months

## 2016-10-17 ENCOUNTER — Ambulatory Visit
Admission: RE | Admit: 2016-10-17 | Discharge: 2016-10-17 | Disposition: A | Discharge status: Home / Self Care | Payer: BLUE CROSS/BLUE SHIELD | Source: Ambulatory Visit | Attending: Radiation Oncology | Admitting: Radiation Oncology

## 2016-10-17 ENCOUNTER — Ambulatory Visit (HOSPITAL_BASED_OUTPATIENT_CLINIC_OR_DEPARTMENT_OTHER): Payer: BLUE CROSS/BLUE SHIELD | Admitting: Hematology and Oncology

## 2016-10-17 ENCOUNTER — Encounter: Payer: Self-pay | Admitting: Hematology and Oncology

## 2016-10-17 DIAGNOSIS — Z170 Estrogen receptor positive status [ER+]: Secondary | ICD-10-CM

## 2016-10-17 DIAGNOSIS — C50412 Malignant neoplasm of upper-outer quadrant of left female breast: Secondary | ICD-10-CM

## 2016-10-17 DIAGNOSIS — Z510 Encounter for antineoplastic radiation therapy: Secondary | ICD-10-CM | POA: Diagnosis not present

## 2016-10-17 NOTE — Progress Notes (Signed)
Patient Care Team: Merrilee Seashore, MD as PCP - General (Internal Medicine) Rolm Bookbinder, MD as Consulting Physician (General Surgery) Nicholas Lose, MD as Consulting Physician (Hematology and Oncology) Kyung Rudd, MD as Consulting Physician (Radiation Oncology)  DIAGNOSIS:  Encounter Diagnosis  Name Primary?  . Malignant neoplasm of upper-outer quadrant of left breast in female, estrogen receptor positive ([place])     SUMMARY OF ONCOLOGIC HISTORY:   Breast cancer of upper-outer quadrant of left female breast ([place])   06/22/2016 Initial Diagnosis    Left breast biopsies subareolar: 3 biopsies showing multifocal invasive lobular carcinoma with LCIS grade 2 with lymphovascular invasion, ER 80%, PR 90%, Ki-67 3%, HER-2 negative ratio 1.13      07/05/2016 Breast MRI    Left breast biopsy 7:00: Grade 2 invasive ductal carcinoma, ER 95%, PR 95%, Ki-67 15%, HER-2 negative ratio 1.48; left breast distortion 1.2 x 1.2 x 0.8 cm; left breast cyst 1.1 cm, T1b N0 stage IA clinical stage      07/11/2016 Surgery    Left mastectomy: ILC grade 2, 6 cm, LCIS, 1/3 lymph nodes micrometastatic disease, ER 80%, PR 90%, HER-2 negative, Ki-67 3%, T3 N0 (i+) stage IIB pathologic stage      08/15/2016 Oncotype testing    Oncotype Dx 6: 7% ROR      09/05/2016 - 10/13/2016 Radiation Therapy     Adjuvant radiation therapy       CHIEF COMPLIANT: Follow-up after radiation  INTERVAL HISTORY: Stephanie Dennis is a 50 year old with above-mentioned history of left breast cancer treated with left mastectomy and completed adjuvant radiation because of 1 lymph node that had micrometastatic disease. She is here today to discuss starting antiestrogen therapy. 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 Overall she done quite well from the treatment.  REVIEW OF SYSTEMS:   Constitutional: Denies fevers, chills or abnormal weight loss Eyes: Denies blurriness of vision Ears, nose, mouth, throat, and face: Denies  mucositis or sore throat Respiratory: Denies cough, dyspnea or wheezes Cardiovascular: Denies palpitation, chest discomfort Gastrointestinal:  Denies nausea, heartburn or change in bowel habits Skin: Denies abnormal skin rashes Lymphatics: Denies new lymphadenopathy or easy bruising Neurological:Denies numbness, tingling or new weaknesses Behavioral/Psych: Mood is stable, no new changes  Extremities: No lower extremity edema Breast: Right mastectomy and radiation All other systems were reviewed with the patient and are negative.  I have reviewed the past medical history, past surgical history, social history and family history with the patient and they are unchanged from previous note.  ALLERGIES:  has No Known Allergies.  MEDICATIONS:  Current Outpatient Prescriptions  Medication Sig Dispense Refill  . tamoxifen (NOLVADEX) 20 MG tablet Take 1 tablet (20 mg total) by mouth daily. (Patient not taking: Reported on 10/07/2016) 30 tablet 6   No current facility-administered medications for this visit.     PHYSICAL EXAMINATION: ECOG PERFORMANCE STATUS: 1 - Symptomatic but completely ambulatory  Vitals:   10/17/16 0851  BP: 129/82  Pulse: 78  Resp: 18  Temp: 98 F (36.7 C)   Filed Weights   10/17/16 0851  Weight: 136 lb 12.8 oz (62.1 kg)    GENERAL:alert, no distress and comfortable SKIN: skin color, texture, turgor are normal, no rashes or significant lesions EYES: normal, Conjunctiva are pink and non-injected, sclera clear OROPHARYNX:no exudate, no erythema and lips, buccal mucosa, and tongue normal  NECK: supple, thyroid normal size, non-tender, without nodularity LYMPH:  no palpable lymphadenopathy in the cervical, axillary or inguinal LUNGS: clear to auscultation  and percussion with normal breathing effort HEART: regular rate & rhythm and no murmurs and no lower extremity edema ABDOMEN:abdomen soft, non-tender and normal bowel sounds MUSCULOSKELETAL:no cyanosis of  digits and no clubbing  NEURO: alert & oriented x 3 with fluent speech, no focal motor/sensory deficits EXTREMITIES: No lower extremity edema  LABORATORY DATA:  I have reviewed the data as listed   Chemistry      Component Value Date/Time   NA 141 07/06/2016 0821   K 3.7 07/06/2016 0821   CO2 26 07/06/2016 0821   BUN 10.4 07/06/2016 0821   CREATININE 0.7 07/06/2016 0821      Component Value Date/Time   CALCIUM 9.6 07/06/2016 0821   ALKPHOS 55 07/06/2016 0821   AST 18 07/06/2016 0821   ALT 11 07/06/2016 0821   BILITOT 0.50 07/06/2016 0821       Lab Results  Component Value Date   WBC 4.4 07/06/2016   HGB 14.2 07/06/2016   HCT 42.5 07/06/2016   MCV 96.6 07/06/2016   PLT 210 07/06/2016   NEUTROABS 2.2 07/06/2016     ASSESSMENT & PLAN:  Breast cancer of upper-outer quadrant of left female breast ([place]) Left mastectomy 07/11/2016: ILC grade 2, 6 cm, LCIS, 1/3 lymph nodes micrometastatic disease, ER 80%, PR 90%, HER-2 negative, Ki-67 3%, T3 N0 (i+) stage IIB pathologic stage Oncotype DX: 6, 7% ROR  Treatment Plan: Adjuvant antiestrogen therapy With tamoxifen 20 mg daily until June 2018 (patient's last menstrual cycle was June 2017) and once she becomes menopausal then she will go on 5 years of aromatase inhibitor therapy.  Tamoxifen Counseling: We discussed the risks and benefits of tamoxifen. These include but not limited to insomnia, hot flashes, mood changes, vaginal dryness, and weight gain. Although rare, serious side effects including endometrial cancer, risk of blood clots were also discussed. We strongly believe that the benefits far outweigh the risks. Patient understands these risks and consented to starting treatment.  I also discussed with her that there is no role of routine scans other than mammograms are surveillance. We also discussed that there is no benefit to doing routine blood work. RTC in April for follow-up to evaluate toxicities to tamoxifen  No  orders of the defined types were placed in this encounter.  The patient has a good understanding of the overall plan. she agrees with it. she will call with any problems that may develop before the next visit here.   Rulon Eisenmenger, MD 10/17/16

## 2016-10-18 ENCOUNTER — Ambulatory Visit
Admission: RE | Admit: 2016-10-18 | Discharge: 2016-10-18 | Disposition: A | Discharge status: Home / Self Care | Payer: BLUE CROSS/BLUE SHIELD | Source: Ambulatory Visit | Attending: Radiation Oncology | Admitting: Radiation Oncology

## 2016-10-18 DIAGNOSIS — Z510 Encounter for antineoplastic radiation therapy: Secondary | ICD-10-CM | POA: Diagnosis not present

## 2016-10-19 ENCOUNTER — Ambulatory Visit: Payer: BLUE CROSS/BLUE SHIELD

## 2016-10-19 ENCOUNTER — Encounter: Payer: Self-pay | Admitting: Radiation Oncology

## 2016-10-19 ENCOUNTER — Ambulatory Visit
Admission: RE | Admit: 2016-10-19 | Discharge: 2016-10-19 | Disposition: A | Discharge status: Home / Self Care | Payer: BLUE CROSS/BLUE SHIELD | Source: Ambulatory Visit | Attending: Radiation Oncology | Admitting: Radiation Oncology

## 2016-10-19 VITALS — BP 126/86 | HR 66 | Resp 16 | Wt 136.6 lb

## 2016-10-19 DIAGNOSIS — Z510 Encounter for antineoplastic radiation therapy: Secondary | ICD-10-CM | POA: Diagnosis not present

## 2016-10-19 DIAGNOSIS — C50412 Malignant neoplasm of upper-outer quadrant of left female breast: Secondary | ICD-10-CM | POA: Insufficient documentation

## 2016-10-19 DIAGNOSIS — Z923 Personal history of irradiation: Secondary | ICD-10-CM | POA: Diagnosis not present

## 2016-10-19 MED ORDER — ALRA NON-METALLIC DEODORANT (RAD-ONC)
1.0000 "application " | Freq: Once | TOPICAL | Status: AC
  Administered 2016-10-19: 1 via TOPICAL

## 2016-10-19 MED ORDER — SONAFINE EX EMUL
1.0000 "application " | Freq: Two times a day (BID) | CUTANEOUS | Status: DC
  Administered 2016-10-19: 1 via TOPICAL

## 2016-10-19 NOTE — Progress Notes (Signed)
  Radiation Oncology         [phone] ________________________________  Name: Stephanie Dennis MRN: [id number]  Date: 10/04/2016  DOB: June 21, 1966  Complex simulation note  The patient has undergone complex simulation for her upcoming boost treatment for her diagnosis of left sided breast cancer. The patient has initially been planned to receive 50.4 Gy. The patient will now receive a 10 Gy boost to the seroma cavity which has been contoured. This will be accomplished using an en face electron field. Based on the depth of the target area, 6 MeV electrons will be used. The patient's final total dose therefore will be 60.4 Gy. A complex isodose plan from the electron [facility] Carlo calculation is requested for the boost treatment.   _______________________________  Jodelle Gross, MD, PhD

## 2016-10-19 NOTE — Progress Notes (Signed)
Weight and vitals stable. Denies pain. No evidence of lymphedema noted in left arm. Hyperpigmentation with minimal dry desquamation of left chest wall noted. Reports using sonafine bid as directed. Patient completes XRT tomorrow. Provided patient with one month follow up appointment card and encouraged her to call with future needs. Also, reviewed [facility], [facility] and [place] flyers. Answered all patient questions to the best of my ability. Provided patient with another tube of sonafine and alra. Patient understands to continue use of each until healed.  BP 126/86 (BP Location: Right Arm, Patient Position: Sitting, Cuff Size: Normal)   Pulse 66   Resp 16   Wt 136 lb 9.6 oz (62 kg)   LMP 06/07/2016   SpO2 100%   BMI 21.39 kg/m  Wt Readings from Last 3 Encounters:  10/19/16 136 lb 9.6 oz (62 kg)  10/17/16 136 lb 12.8 oz (62.1 kg)  10/07/16 137 lb 3.2 oz (62.2 kg)

## 2016-10-19 NOTE — Progress Notes (Signed)
   Department of Radiation Oncology  Phone:  [phone] Fax:        [phone]  Weekly Treatment Note    Name: Stephanie Dennis Date: 10/19/2016 MRN: [id number] DOB: 01-13-1966   Diagnosis:     ICD-9-CM ICD-10-CM   1. Malignant neoplasm of upper-outer quadrant of left female breast, unspecified estrogen receptor status ([facility]) 174.4 C50.412 SONAFINE emulsion 1 application     non-metallic deodorant (ALRA) 1 application     Current dose: 58.4 Gy  Current fraction: 32   MEDICATIONS: Current Outpatient Prescriptions  Medication Sig Dispense Refill  . non-metallic deodorant (ALRA) MISC Apply 1 application topically daily as needed.    . Wound Dressings (SONAFINE EX) Apply topically.    . tamoxifen (NOLVADEX) 20 MG tablet Take 1 tablet (20 mg total) by mouth daily. (Patient not taking: Reported on 10/19/2016) 30 tablet 6   Current Facility-Administered Medications  Medication Dose Route Frequency Provider Last Rate Last Dose  . SONAFINE emulsion 1 application  1 application Topical BID Kyung Rudd, MD   1 application at [id number] [phone]     ALLERGIES: Patient has no known allergies.   LABORATORY DATA:  Lab Results  Component Value Date   WBC 4.4 07/06/2016   HGB 14.2 07/06/2016   HCT 42.5 07/06/2016   MCV 96.6 07/06/2016   PLT 210 07/06/2016   Lab Results  Component Value Date   NA 141 07/06/2016   K 3.7 07/06/2016   CO2 26 07/06/2016   Lab Results  Component Value Date   ALT 11 07/06/2016   AST 18 07/06/2016   ALKPHOS 55 07/06/2016   BILITOT 0.50 07/06/2016     NARRATIVE: [name] [place] was seen today for weekly treatment management. The chart was checked and the patient's films were reviewed.  Weight and vitals stable. Denies pain. Per nursing, no evidence of lymphedema in the left arm. Additionally, nursing notes hyperpigmentation with minimal dry desquamation of the left chest wall. Reports using sonafine bid as directed. Patient completes XRT  tomorrow.  PHYSICAL EXAMINATION: weight is 136 lb 9.6 oz (62 kg). Her blood pressure is 126/86 and her pulse is 66. Her respiration is 16 and oxygen saturation is 100%.      She has hyperpigmentation in the treatment area with diffuse dry desquamation.  ASSESSMENT: The patient is doing satisfactorily with treatment.  PLAN: We will continue with the patient's radiation treatment as planned. We discussed using Vitamin E oil or lotion in the treatment area to aid with skin irritation.   ------------------------------------------------  Jodelle Gross, MD, PhD  This document serves as a record of services personally performed by Kyung Rudd, MD. It was created on his behalf by Maryla Morrow, a trained medical scribe. The creation of this record is based on the scribe's personal observations and the provider's statements to them. This document has been checked and approved by the attending provider.

## 2016-10-20 ENCOUNTER — Telehealth: Payer: Self-pay | Admitting: *Deleted

## 2016-10-20 ENCOUNTER — Ambulatory Visit: Payer: BLUE CROSS/BLUE SHIELD

## 2016-10-20 ENCOUNTER — Encounter: Payer: Self-pay | Admitting: Radiation Oncology

## 2016-10-20 ENCOUNTER — Ambulatory Visit
Admission: RE | Admit: 2016-10-20 | Discharge: 2016-10-20 | Disposition: A | Discharge status: Home / Self Care | Payer: BLUE CROSS/BLUE SHIELD | Source: Ambulatory Visit | Attending: Radiation Oncology | Admitting: Radiation Oncology

## 2016-10-20 DIAGNOSIS — Z510 Encounter for antineoplastic radiation therapy: Secondary | ICD-10-CM | POA: Diagnosis not present

## 2016-10-20 NOTE — Telephone Encounter (Signed)
  Oncology Nurse Navigator Documentation  Navigator Location: [facility] (10/20/16 1000)   )Navigator Encounter Type: Treatment (10/20/16 1000)                     Patient Visit Type: [id number] (10/20/16 1000) Treatment Phase: Final Radiation Tx (10/20/16 1000)                            Time Spent with Patient: 15 (10/20/16 1000)

## 2016-11-12 NOTE — Progress Notes (Signed)
  Radiation Oncology         [phone] ________________________________  Name: Stephanie Dennis MRN: [id number]  Date: 10/20/2016  DOB: 23-Nov-1966  End of Treatment Note  Diagnosis:   left-sided breast cancer     Indication for treatment:  Curative       Radiation treatment dates:   09/05/2016 through 10/20/2016  Site/dose:   The patient initially received a dose of 50.4 Gy in 28 fractions to the chest wall and supraclavicular region. This was delivered using a 3-D conformal, 4 field technique. The patient then received a boost to the mastectomy scar. This delivered an additional 10 Gy in 5 fractions using an en face electron field. The total dose was 60.4 Gy.  Narrative: The patient tolerated radiation treatment relatively well.   The patient had some expected skin irritation as she progressed during treatment. Significant desquamation was present at the end of treatment. The patient's skin was expected to heal well after completion of treatment.  Plan: The patient has completed radiation treatment. The patient will return to radiation oncology clinic for routine followup in one month. I advised the patient to call or return sooner if they have any questions or concerns related to their recovery or treatment. ________________________________  Jodelle Gross, M.D., Ph.D.

## 2016-11-23 ENCOUNTER — Ambulatory Visit (INDEPENDENT_AMBULATORY_CARE_PROVIDER_SITE_OTHER): Payer: BLUE CROSS/BLUE SHIELD | Admitting: Ophthalmology

## 2016-11-23 DIAGNOSIS — H43813 Vitreous degeneration, bilateral: Secondary | ICD-10-CM

## 2016-11-23 DIAGNOSIS — H35721 Serous detachment of retinal pigment epithelium, right eye: Secondary | ICD-10-CM | POA: Diagnosis not present

## 2016-11-25 NOTE — Progress Notes (Signed)
Mrs.  is here for a one month follow up appointment for Left-sided breast cancer one month FU.  Skin status:LCW with hyperpigmentation What lotion are you using? Using lotion with vitamin E. Have you seen med onc? If not, when is appointment: 10-17-16 Dr. Lindi Adie If they are ER+, have they started Al or Tamoxifen? If not, why? 11-04-16 Tamoxifen Discuss survivorship appointment. Not scheduled yet Have you had a mammogram scheduled? Not scheduled yet Offer referral to [employer]/[facility]. Will receive at the survivorship appointment. Appetite: Good Pain: None Fatigue:None Arm mobility:No problem raising left arm Wt Readings from Last 3 Encounters:  11/30/16 138 lb 6.4 oz (62.8 kg)  10/19/16 136 lb 9.6 oz (62 kg)  10/17/16 136 lb 12.8 oz (62.1 kg)  BP 117/77   Pulse 80   Temp 98.6 F (37 C) (Oral)   Resp 18   Ht 5\' 7"  (1.702 m)   Wt 138 lb 6.4 oz (62.8 kg)   SpO2 99%   BMI 21.68 kg/m

## 2016-11-25 NOTE — Progress Notes (Deleted)
Mrs.  is here for a one month follow up appointment for Left-sided breast cancer one month FU.  Skin status: What lotion are you using?  Have you seen med onc? If not, when is appointment: If they are ER+, have they started Al or Tamoxifen? If not, why?  Discuss survivorship appointment.  Have you had a mammogram scheduled? Not scheduled yet Offer referral to [facility]. Will receive at the survivorship appointment. Appetite: Pain: Fatigue: Arm mobility:

## 2016-11-30 ENCOUNTER — Encounter: Payer: Self-pay | Admitting: Radiation Oncology

## 2016-11-30 ENCOUNTER — Ambulatory Visit
Admission: RE | Admit: 2016-11-30 | Discharge: 2016-11-30 | Disposition: A | Discharge status: Home / Self Care | Payer: BLUE CROSS/BLUE SHIELD | Source: Ambulatory Visit | Attending: Radiation Oncology | Admitting: Radiation Oncology

## 2016-11-30 VITALS — BP 117/77 | HR 80 | Temp 98.6°F | Resp 18 | Ht 67.0 in | Wt 138.4 lb

## 2016-11-30 DIAGNOSIS — Z5189 Encounter for other specified aftercare: Secondary | ICD-10-CM | POA: Diagnosis present

## 2016-11-30 DIAGNOSIS — Z923 Personal history of irradiation: Secondary | ICD-10-CM | POA: Diagnosis not present

## 2016-11-30 DIAGNOSIS — C50912 Malignant neoplasm of unspecified site of left female breast: Secondary | ICD-10-CM | POA: Insufficient documentation

## 2016-11-30 DIAGNOSIS — C50412 Malignant neoplasm of upper-outer quadrant of left female breast: Secondary | ICD-10-CM

## 2016-11-30 NOTE — Addendum Note (Signed)
Encounter addended by: Malena Edman, RN on: 11/30/2016  4:48 PM<BR>    Actions taken: Charge Capture section accepted

## 2016-11-30 NOTE — Progress Notes (Signed)
  Radiation Oncology         [phone] ________________________________  Name: Stephanie Dennis MRN: [id number]  Date: 11/30/2016  DOB: 02-Feb-1966  Post Treatment Note  CC: Merrilee Seashore, MD  Rolm Bookbinder, MD  Diagnosis:   Stage IIB, T3, N0, M0 E/PR postive, invasive ductal carcinoma of the left breast.   Interval Since Last Radiation:  6 weeks   09/05/2016 through 10/20/2016: The patient initially received a dose of 50.4 Gy in 28 fractions to the chest wall and supraclavicular region. This was delivered using a 3-D conformal, 4 field technique. The patient then received a boost to the mastectomy scar. This delivered an additional 10 Gy in 5 fractions using an en face electron field. The total dose was 60.4 Gy.  Narrative:  The patient returns today for routine follow-up. During treatment she did very well with radiotherapy and did not have significant desquamation.                             On review of systems, the patient states she is doing well overall. She started Tamoxifen about 3 weeks ago and is feeling pretty well. She denies fatigue, joint pains, or significant hot flashes. She states she feels about the same as she did prior to treatment. She denies any skin concerns at this point as well.   ALLERGIES:  has No Known Allergies.  Meds: Current Outpatient Prescriptions  Medication Sig Dispense Refill  . tamoxifen (NOLVADEX) 20 MG tablet Take 1 tablet (20 mg total) by mouth daily. 30 tablet 6   No current facility-administered medications for this encounter.     Physical Findings:  height is 5\' 7"  (1.702 m) and weight is 138 lb 6.4 oz (62.8 kg). Her oral temperature is 98.6 F (37 C). Her blood pressure is 117/77 and her pulse is 80. Her respiration is 18 and oxygen saturation is 99%.  In general this is a well appearing caucasian female in no acute distress. She's alert and oriented x4 and appropriate throughout the examination. Cardiopulmonary assessment is  negative for acute distress and she exhibits normal effort. The left chest was examined and reveals a well healed mastectomy scar with minimal hyperpigmentation, and no skin breakdown.   Lab Findings: Lab Results  Component Value Date   WBC 4.4 07/06/2016   HGB 14.2 07/06/2016   HCT 42.5 07/06/2016   MCV 96.6 07/06/2016   PLT 210 07/06/2016     Radiographic Findings: No results found.  Impression/Plan: 1. Stage IIB, T3, N0, M0 E/PR postive, invasive ductal carcinoma of the left breast. The patient has been doing well since completion of radiotherapy. We discussed that we would be happy to continue to follow her as needed, but she will also continue to follow up with Dr. Lindi Adie in medical oncology and she is to start Anastrazole. She was counseled on skin care, as well as encouraged to consider sunscreen over the upper chest wall to avoid sunburn.  2. Survivorship. This will be coordinated by medical oncology in the near future.     Carola Rhine, PAC

## 2016-12-06 ENCOUNTER — Ambulatory Visit: Payer: Self-pay | Admitting: Radiation Oncology

## 2017-01-17 ENCOUNTER — Ambulatory Visit: Payer: BLUE CROSS/BLUE SHIELD | Admitting: Hematology and Oncology

## 2017-02-08 ENCOUNTER — Telehealth: Payer: Self-pay | Admitting: *Deleted

## 2017-02-08 DIAGNOSIS — C50412 Malignant neoplasm of upper-outer quadrant of left female breast: Secondary | ICD-10-CM | POA: Diagnosis not present

## 2017-02-08 NOTE — Telephone Encounter (Signed)
Left vm informing pt of yoga class date and time as well as support services will mail her information as well. Contact information provided.

## 2017-02-12 ENCOUNTER — Telehealth: Payer: Self-pay

## 2017-02-12 NOTE — Telephone Encounter (Signed)
Called and left a ,essage with a new appt

## 2017-03-16 ENCOUNTER — Ambulatory Visit: Payer: BLUE CROSS/BLUE SHIELD | Admitting: Hematology and Oncology

## 2017-03-17 ENCOUNTER — Ambulatory Visit: Payer: BLUE CROSS/BLUE SHIELD | Admitting: Hematology and Oncology

## 2017-03-28 NOTE — Assessment & Plan Note (Signed)
Left mastectomy 07/11/2016: ILC grade 2, 6 cm, LCIS, 1/3 lymph nodes micrometastatic disease, ER 80%, PR 90%, HER-2 negative, Ki-67 3%, T3 N0 (i+) stage IIB pathologic stage Oncotype DX: 6, 7% ROR  Treatment Plan: Adjuvant antiestrogen therapy With tamoxifen 20 mg daily until June 2018 (patient's last menstrual cycle was June 2017) and once she becomes menopausal then she will go on 5 years of aromatase inhibitor therapy.  Tamoxifen Toxicities:  RTC in 6 months 

## 2017-03-29 ENCOUNTER — Encounter (INDEPENDENT_AMBULATORY_CARE_PROVIDER_SITE_OTHER): Payer: Self-pay

## 2017-03-29 ENCOUNTER — Encounter: Payer: Self-pay | Admitting: Hematology and Oncology

## 2017-03-29 ENCOUNTER — Ambulatory Visit (HOSPITAL_BASED_OUTPATIENT_CLINIC_OR_DEPARTMENT_OTHER): Payer: BLUE CROSS/BLUE SHIELD | Admitting: Hematology and Oncology

## 2017-03-29 DIAGNOSIS — C50412 Malignant neoplasm of upper-outer quadrant of left female breast: Secondary | ICD-10-CM | POA: Diagnosis not present

## 2017-03-29 DIAGNOSIS — Z170 Estrogen receptor positive status [ER+]: Secondary | ICD-10-CM

## 2017-03-29 DIAGNOSIS — Z79810 Long term (current) use of selective estrogen receptor modulators (SERMs): Secondary | ICD-10-CM | POA: Diagnosis not present

## 2017-03-29 MED ORDER — ANASTROZOLE 1 MG PO TABS: 1.0000 mg | ORAL_TABLET | Freq: Every day | ORAL | 3 refills | Status: DC

## 2017-03-29 NOTE — Progress Notes (Signed)
Patient Care Team: Merrilee Seashore, MD as PCP - General (Internal Medicine) Rolm Bookbinder, MD as Consulting Physician (General Surgery) Nicholas Lose, MD as Consulting Physician (Hematology and Oncology) Kyung Rudd, MD as Consulting Physician (Radiation Oncology)  DIAGNOSIS:  Encounter Diagnosis  Name Primary?  . Malignant neoplasm of upper-outer quadrant of left breast in female, estrogen receptor positive ([place])     SUMMARY OF ONCOLOGIC HISTORY:   Breast cancer of upper-outer quadrant of left female breast ([place])   06/22/2016 Initial Diagnosis    Left breast biopsies subareolar: 3 biopsies showing multifocal invasive lobular carcinoma with LCIS grade 2 with lymphovascular invasion, ER 80%, PR 90%, Ki-67 3%, HER-2 negative ratio 1.13      07/05/2016 Breast MRI    Left breast biopsy 7:00: Grade 2 invasive ductal carcinoma, ER 95%, PR 95%, Ki-67 15%, HER-2 negative ratio 1.48; left breast distortion 1.2 x 1.2 x 0.8 cm; left breast cyst 1.1 cm, T1b N0 stage IA clinical stage      07/11/2016 Surgery    Left mastectomy: ILC grade 2, 6 cm, LCIS, 1/3 lymph nodes micrometastatic disease, ER 80%, PR 90%, HER-2 negative, Ki-67 3%, T3 N0 (i+) stage IIB pathologic stage      08/15/2016 Oncotype testing    Oncotype Dx 6: 7% ROR      09/05/2016 - 10/13/2016 Radiation Therapy     Adjuvant radiation therapy      12/06/2016 -  Anti-estrogen oral therapy    Tamoxifen switched to anastrozole 03/29/2017 (after she became menopausal)       CHIEF COMPLIANT: Follow-up on tamoxifen therapy and weight gain  INTERVAL HISTORY: KHRISTIE SAK is a 51 year old with above-mentioned history of left breast cancer treated with mastectomy followed by adjuvant radiation and who was on tamoxifen since January 2018. She is now clinically postmenopausal and we are here today to discuss changing her from tamoxifen to anastrozole therapy. She complains of some muscle stiffness and achiness but otherwise has been  tolerating tamoxifen well.  REVIEW OF SYSTEMS:   Constitutional: Denies fevers, chills or abnormal weight loss Eyes: Denies blurriness of vision Ears, nose, mouth, throat, and face: Denies mucositis or sore throat Respiratory: Denies cough, dyspnea or wheezes Cardiovascular: Denies palpitation, chest discomfort Gastrointestinal:  Denies nausea, heartburn or change in bowel habits Skin: Denies abnormal skin rashes Lymphatics: Denies new lymphadenopathy or easy bruising Neurological:Denies numbness, tingling or new weaknesses Behavioral/Psych: Mood is stable, no new changes  Extremities: No lower extremity edema Breast:  denies any pain or lumps or nodules in either breasts All other systems were reviewed with the patient and are negative.  I have reviewed the past medical history, past surgical history, social history and family history with the patient and they are unchanged from previous note.  ALLERGIES:  has No Known Allergies.  MEDICATIONS:  Current Outpatient Prescriptions  Medication Sig Dispense Refill  . anastrozole (ARIMIDEX) 1 MG tablet Take 1 tablet (1 mg total) by mouth daily. 90 tablet 3   No current facility-administered medications for this visit.     PHYSICAL EXAMINATION: ECOG PERFORMANCE STATUS: 1 - Symptomatic but completely ambulatory  Vitals:   03/29/17 0918  BP: 123/76  Pulse: (!) 58  Resp: 18  Temp: 97.5 F (36.4 C)   Filed Weights   03/29/17 0918  Weight: 144 lb 1.6 oz (65.4 kg)    GENERAL:alert, no distress and comfortable SKIN: skin color, texture, turgor are normal, no rashes or significant lesions EYES: normal, Conjunctiva are pink and non-injected,  sclera clear OROPHARYNX:no exudate, no erythema and lips, buccal mucosa, and tongue normal  NECK: supple, thyroid normal size, non-tender, without nodularity LYMPH:  no palpable lymphadenopathy in the cervical, axillary or inguinal LUNGS: clear to auscultation and percussion with normal  breathing effort HEART: regular rate & rhythm and no murmurs and no lower extremity edema ABDOMEN:abdomen soft, non-tender and normal bowel sounds MUSCULOSKELETAL:no cyanosis of digits and no clubbing  NEURO: alert & oriented x 3 with fluent speech, no focal motor/sensory deficits EXTREMITIES: No lower extremity edema  LABORATORY DATA:  I have reviewed the data as listed   Chemistry      Component Value Date/Time   NA 141 07/06/2016 0821   K 3.7 07/06/2016 0821   CO2 26 07/06/2016 0821   BUN 10.4 07/06/2016 0821   CREATININE 0.7 07/06/2016 0821      Component Value Date/Time   CALCIUM 9.6 07/06/2016 0821   ALKPHOS 55 07/06/2016 0821   AST 18 07/06/2016 0821   ALT 11 07/06/2016 0821   BILITOT 0.50 07/06/2016 0821       Lab Results  Component Value Date   WBC 4.4 07/06/2016   HGB 14.2 07/06/2016   HCT 42.5 07/06/2016   MCV 96.6 07/06/2016   PLT 210 07/06/2016   NEUTROABS 2.2 07/06/2016    ASSESSMENT & PLAN:  Breast cancer of upper-outer quadrant of left female breast ([place]) Left mastectomy 07/11/2016: ILC grade 2, 6 cm, LCIS, 1/3 lymph nodes micrometastatic disease, ER 80%, PR 90%, HER-2 negative, Ki-67 3%, T3 N0 (i+) stage IIB pathologic stage Oncotype DX: 6, 7% ROR  Treatment Plan: Adjuvant antiestrogen therapy With tamoxifen 20 mg daily until June 2018 (patient's last menstrual cycle was June 2017) and once she becomes menopausal then she will go on 5 years of aromatase inhibitor therapy.  Tamoxifen Toxicities: Occasional myalgias because she is postmenopausal, I discussed with her about switching her to anastrozole. Anastrozole counseling:We discussed the risks and benefits of anti-estrogen therapy with aromatase inhibitors. These include but not limited to insomnia, hot flashes, mood changes, vaginal dryness, bone density loss, and weight gain. We strongly believe that the benefits far outweigh the risks. Patient understands these risks and consented to starting  treatment. Planned treatment duration is 5-10 years.  RTC in 3 months   I spent 25 minutes talking to the patient of which more than half was spent in counseling and coordination of care.  No orders of the defined types were placed in this encounter.  The patient has a good understanding of the overall plan. she agrees with it. she will call with any problems that may develop before the next visit here.   Rulon Eisenmenger, MD 03/29/17

## 2017-03-31 NOTE — Progress Notes (Unsigned)
Received fax copy of pt bone density report from [facility]), shows osteopenia. Dr.Gudena aware. Sent to scan.

## 2017-06-15 ENCOUNTER — Ambulatory Visit (HOSPITAL_BASED_OUTPATIENT_CLINIC_OR_DEPARTMENT_OTHER): Payer: BLUE CROSS/BLUE SHIELD | Admitting: Adult Health

## 2017-06-15 VITALS — BP 141/78 | HR 71 | Temp 98.2°F | Resp 18 | Ht 67.0 in | Wt 139.5 lb

## 2017-06-15 DIAGNOSIS — Z170 Estrogen receptor positive status [ER+]: Secondary | ICD-10-CM

## 2017-06-15 DIAGNOSIS — C50919 Malignant neoplasm of unspecified site of unspecified female breast: Secondary | ICD-10-CM | POA: Diagnosis not present

## 2017-06-15 DIAGNOSIS — C50412 Malignant neoplasm of upper-outer quadrant of left female breast: Secondary | ICD-10-CM

## 2017-06-17 ENCOUNTER — Encounter: Payer: Self-pay | Admitting: Adult Health

## 2017-06-17 NOTE — Progress Notes (Signed)
CLINIC:  Survivorship   REASON FOR VISIT:  Routine follow-up post-treatment for a recent history of breast cancer.  BRIEF ONCOLOGIC HISTORY:    Breast cancer of upper-outer quadrant of left female breast ([place])   06/22/2016 Initial Diagnosis    Left breast biopsies subareolar: 3 biopsies showing multifocal invasive lobular carcinoma with LCIS grade 2 with lymphovascular invasion, ER 80%, PR 90%, Ki-67 3%, HER-2 negative ratio 1.13      07/05/2016 Breast MRI    Left breast biopsy 7:00: Grade 2 invasive ductal carcinoma, ER 95%, PR 95%, Ki-67 15%, HER-2 negative ratio 1.48; left breast distortion 1.2 x 1.2 x 0.8 cm; left breast cyst 1.1 cm, T1b N0 stage IA clinical stage      07/11/2016 Surgery    Left mastectomy: ILC grade 2, 6 cm, LCIS, 1/3 lymph nodes micrometastatic disease, ER 80%, PR 90%, HER-2 negative, Ki-67 3%, T3 N0 (i+) stage IIB pathologic stage      08/15/2016 Oncotype testing    Oncotype Dx 6: 7% ROR      09/05/2016 - 10/20/2016 Radiation Therapy     Adjuvant radiation therapy      12/06/2016 -  Anti-estrogen oral therapy    Tamoxifen switched to anastrozole 03/29/2017 (after she became menopausal)       INTERVAL HISTORY:  Stephanie Dennis presents to the [facility] today for our initial meeting to review her survivorship care plan detailing her treatment course for breast cancer, as well as monitoring long-term side effects of that treatment, education regarding health maintenance, screening, and overall wellness and health promotion.     Overall, [name] reports feeling quite well.  She is taking Anastrozole and is tolerating without any issues.  She has increased her calcium intake after her bone density in 03/2017 raised the issue of early osteopenia.      REVIEW OF SYSTEMS:  Review of Systems  Constitutional: Negative for appetite change, chills, fatigue, fever and unexpected weight change.  HENT:   Negative for hearing loss and lump/mass.   Eyes:  Negative for eye problems and icterus.  Respiratory: Negative for chest tightness, cough and shortness of breath.   Cardiovascular: Negative for chest pain, leg swelling and palpitations.  Gastrointestinal: Negative for abdominal distention, abdominal pain, constipation, diarrhea, nausea and vomiting.  Endocrine: Negative for hot flashes.  Genitourinary: Negative for difficulty urinating.   Musculoskeletal: Negative for arthralgias.  Skin: Negative for itching and rash.  Neurological: Negative for dizziness, extremity weakness, headaches and numbness.  Hematological: Negative for adenopathy. Does not bruise/bleed easily.  Psychiatric/Behavioral: Negative for depression. The patient is not nervous/anxious.    Breast: Denies any new nodularity, masses, tenderness, nipple changes, or nipple discharge.      ONCOLOGY TREATMENT TEAM:  1. Surgeon:  Dr. Donne Hazel at [facility] 2. Medical Oncologist: Dr. Lindi Adie  3. Radiation Oncologist: Dr. Lisbeth Renshaw    PAST MEDICAL/SURGICAL HISTORY:  Past Medical History:  Diagnosis Date  . Breast cancer ([place])   . Breast cancer of upper-outer quadrant of left female breast ([place]) 06/24/2016   Past Surgical History:  Procedure Laterality Date  . CESAREAN SECTION    . MASTECTOMY W/ SENTINEL NODE BIOPSY Left 07/11/2016   Procedure: LEFT TOTAL MASTECTOMY WITH SENTINEL LYMPH NODE BIOPSY;  Surgeon: Rolm Bookbinder, MD;  Location: [place];  Service: General;  Laterality: Left;  . WISDOM TOOTH EXTRACTION       ALLERGIES:  No Known Allergies   CURRENT MEDICATIONS:  Outpatient Encounter Prescriptions as  of 06/15/2017  Medication Sig  . anastrozole (ARIMIDEX) 1 MG tablet Take 1 tablet (1 mg total) by mouth daily.  . Calcium Carbonate-Vitamin D (CALCIUM 500 + D) 500-125 MG-UNIT TABS Take 1 tablet by mouth 2 (two) times daily.   No facility-administered encounter medications on file as of 06/15/2017.      ONCOLOGIC FAMILY  HISTORY:  Family History  Problem Relation Age of Onset  . Breast cancer Mother   . Prostate cancer Father      GENETIC COUNSELING/TESTING: Not at this time  SOCIAL HISTORY:  JOHNNI Dennis is single and lives in [place], [place].   Stephanie Dennis is currently working full time.  She denies any current or history of tobacco, alcohol, or illicit drug use.     PHYSICAL EXAMINATION:  Vital Signs:   Vitals:   06/15/17 1039  BP: (!) 141/78  Pulse: 71  Resp: 18  Temp: 98.2 F (36.8 C)   Filed Weights   06/15/17 1039  Weight: 139 lb 8 oz (63.3 kg)   General: Well-nourished, well-appearing female in no acute distress.  She is unaccompanied today.   HEENT: Head is normocephalic.  Pupils equal and reactive to light. Conjunctivae clear without exudate.  Sclerae anicteric. Oral mucosa is pink, moist.  Oropharynx is pink without lesions or erythema.  Lymph: No cervical, supraclavicular, or infraclavicular lymphadenopathy noted on palpation.  Cardiovascular: Regular rate and rhythm.Marland Kitchen Respiratory: Clear to auscultation bilaterally. Chest expansion symmetric; breathing non-labored.  Breasts: s/p left mastectomy, no nodularity, right breast without nodules, masses, skin or nipple changes GI: Abdomen soft and round; non-tender, non-distended. Bowel sounds normoactive.  GU: Deferred.  Neuro: No focal deficits. Steady gait.  Psych: Mood and affect normal and appropriate for situation.  Extremities: No edema. MSK: No focal spinal tenderness to palpation.  Full range of motion in bilateral upper extremities Skin: Warm and dry.  LABORATORY DATA:  None for this visit.  DIAGNOSTIC IMAGING:  None for this visit.      ASSESSMENT AND PLAN:  [name] is a pleasant 51 y.o. female with Stage IIB left breast invasive lobular carcinoma, ER+/PR+/HER2-, diagnosed in 06/2016, treated with mastectomy, adjuvant radiation therapy, and anti-estrogen therapy with Tamoxifen then Anastrozole  starting in January, 2018.  She presents to the Survivorship Clinic for our initial meeting and routine follow-up post-completion of treatment for breast cancer.    1. Stage IIB left breast cancer:  Stephanie Dennis is continuing to recover from definitive treatment for breast cancer. She will follow-up with her medical oncologist, Dr. Lindi Adie in 12/2017 with history and physical exam per surveillance protocol.  She will continue her anti-estrogen therapy with Anastrozole. Thus far, she is tolerating the Anastrozole well, with minimal side effects. She was instructed to make Dr. Lindi Adie or myself aware if she begins to experience any worsening side effects of the medication and I could see her back in clinic to help manage those side effects, as needed. Today, a comprehensive survivorship care plan and treatment summary was reviewed with the patient today detailing her breast cancer diagnosis, treatment course, potential late/long-term effects of treatment, appropriate follow-up care with recommendations for the future, and patient education resources.  A copy of this summary, along with a letter will be sent to the patient's primary care provider via mail/fax/In Basket message after today's visit.    2. Bone health:  Given Ms. Scioli's age/history of breast cancer and her current treatment regimen including anti-estrogen therapy with Anastrozole, she is at risk  for bone demineralization.  Her last DEXA scan was 03/2017 and was concerning for osteopenia.  She has increased her calcium supplementation in addition to her weight bearing exercise.  She was given education on specific activities to promote bone health.  3. Cancer screening:  Due to Ms. Mcnealy's history and her age, she should receive screening for skin cancers, colon cancer, and gynecologic cancers.  The information and recommendations are listed on the patient's comprehensive care plan/treatment summary and were reviewed in detail with the patient.    4.  Health maintenance and wellness promotion: Ms. Gillean was encouraged to consume 5-7 servings of fruits and vegetables per day. We reviewed the "Nutrition Rainbow" handout, as well as the handout "Take Control of Your Health and Reduce Your Cancer Risk" from the [place].  She was also encouraged to engage in moderate to vigorous exercise for 30 minutes per day most days of the week. We discussed the [employer] fitness program, which is designed for cancer survivors to help them become more physically fit after cancer treatments.  She was instructed to limit her alcohol consumption and continue to abstain from tobacco use.     5. Support services/counseling: It is not uncommon for this period of the patient's cancer care trajectory to be one of many emotions and stressors.  We discussed an opportunity for her to participate in the next session of [facility] ("Finding Your New Normal") support group series designed for patients after they have completed treatment.   Ms. Bartelt was encouraged to take advantage of our many other support services programs, support groups, and/or counseling in coping with her new life as a cancer survivor after completing anti-cancer treatment.  She was offered support today through active listening and expressive supportive counseling.  She was given information regarding our available services and encouraged to contact me with any questions or for help enrolling in any of our support group/programs.    Dispo:   -Return to cancer center for follow up with Dr. Lindi Adie in 12/2017  -Mammogram due in 06/2017 -Bone Density due 03/2019 -Follow up with Dr. Donne Hazel at [facility] in 06/2018 -She is welcome to return back to the Survivorship Clinic at any time; no additional follow-up needed at this time.  -Consider referral back to survivorship as a long-term survivor for continued surveillance  A total of (30) minutes of face-to-face time was spent with this  patient with greater than 50% of that time in counseling and care-coordination.   Gardenia Phlegm, NP Survivorship Program [facility] [phone]   Note: PRIMARY CARE PROVIDER Merrilee Seashore, [place] [phone]

## 2017-06-20 DIAGNOSIS — R922 Inconclusive mammogram: Secondary | ICD-10-CM | POA: Diagnosis not present

## 2017-06-20 DIAGNOSIS — Z853 Personal history of malignant neoplasm of breast: Secondary | ICD-10-CM | POA: Diagnosis not present

## 2017-06-28 ENCOUNTER — Ambulatory Visit: Payer: BLUE CROSS/BLUE SHIELD | Admitting: Hematology and Oncology

## 2017-08-22 DIAGNOSIS — Z23 Encounter for immunization: Secondary | ICD-10-CM | POA: Diagnosis not present

## 2017-11-23 ENCOUNTER — Ambulatory Visit (INDEPENDENT_AMBULATORY_CARE_PROVIDER_SITE_OTHER): Payer: BLUE CROSS/BLUE SHIELD | Admitting: Ophthalmology

## 2017-12-11 ENCOUNTER — Encounter (INDEPENDENT_AMBULATORY_CARE_PROVIDER_SITE_OTHER): Payer: BLUE CROSS/BLUE SHIELD | Admitting: Ophthalmology

## 2017-12-11 DIAGNOSIS — H43813 Vitreous degeneration, bilateral: Secondary | ICD-10-CM

## 2017-12-11 DIAGNOSIS — H2513 Age-related nuclear cataract, bilateral: Secondary | ICD-10-CM | POA: Diagnosis not present

## 2017-12-11 DIAGNOSIS — H35721 Serous detachment of retinal pigment epithelium, right eye: Secondary | ICD-10-CM

## 2017-12-14 NOTE — Assessment & Plan Note (Signed)
Left mastectomy 07/11/2016: ILC grade 2, 6 cm, LCIS, 1/3 lymph nodes micrometastatic disease, ER 80%, PR 90%, HER-2 negative, Ki-67 3%, T3 N0 (i+) stage IIB pathologic stage Oncotype DX: 6, 7% ROR  Treatment Plan: Adjuvant antiestrogen therapy Withtamoxifen 20 mg daily until June 2018 (patient's last menstrual cycle was June 2017) andonce she became menopausal she is now on Anastrozole  Anastrozole Toxicities:  Surveillance: 1. Breast Exam: 12/14/17 2. Rt Mammogram:   RTC in 1 year

## 2017-12-15 ENCOUNTER — Telehealth: Payer: Self-pay | Admitting: Hematology and Oncology

## 2017-12-15 ENCOUNTER — Inpatient Hospital Stay: Payer: BLUE CROSS/BLUE SHIELD | Attending: Hematology and Oncology | Admitting: Hematology and Oncology

## 2017-12-15 ENCOUNTER — Telehealth: Payer: Self-pay

## 2017-12-15 DIAGNOSIS — M791 Myalgia, unspecified site: Secondary | ICD-10-CM | POA: Insufficient documentation

## 2017-12-15 DIAGNOSIS — M2550 Pain in unspecified joint: Secondary | ICD-10-CM | POA: Insufficient documentation

## 2017-12-15 DIAGNOSIS — Z170 Estrogen receptor positive status [ER+]: Secondary | ICD-10-CM | POA: Insufficient documentation

## 2017-12-15 DIAGNOSIS — C50412 Malignant neoplasm of upper-outer quadrant of left female breast: Secondary | ICD-10-CM | POA: Diagnosis not present

## 2017-12-15 MED ORDER — ANASTROZOLE 1 MG PO TABS: 1.0000 mg | ORAL_TABLET | Freq: Every day | ORAL | 3 refills | Status: DC

## 2017-12-15 NOTE — Telephone Encounter (Signed)
VM from pt and she would like her pharmacy changed to [facility]. Updated her chart with her preferred pharmacy.

## 2017-12-15 NOTE — Telephone Encounter (Signed)
Gave patient AVs and calendar of upcoming January 2020 appointments.  °

## 2017-12-15 NOTE — Progress Notes (Signed)
Patient Care Team: Merrilee Seashore, MD as PCP - General (Internal Medicine) Rolm Bookbinder, MD as Consulting Physician (General Surgery) Nicholas Lose, MD as Consulting Physician (Hematology and Oncology) Kyung Rudd, MD as Consulting Physician (Radiation Oncology) Delice Bison Charlestine Massed, NP as Nurse Practitioner (Hematology and Oncology)  DIAGNOSIS:  Encounter Diagnosis  Name Primary?  . Malignant neoplasm of upper-outer quadrant of left breast in female, estrogen receptor positive ([place])     SUMMARY OF ONCOLOGIC HISTORY:   Breast cancer of upper-outer quadrant of left female breast ([place])   06/22/2016 Initial Diagnosis    Left breast biopsies subareolar: 3 biopsies showing multifocal invasive lobular carcinoma with LCIS grade 2 with lymphovascular invasion, ER 80%, PR 90%, Ki-67 3%, HER-2 negative ratio 1.13      07/05/2016 Breast MRI    Left breast biopsy 7:00: Grade 2 invasive ductal carcinoma, ER 95%, PR 95%, Ki-67 15%, HER-2 negative ratio 1.48; left breast distortion 1.2 x 1.2 x 0.8 cm; left breast cyst 1.1 cm, T1b N0 stage IA clinical stage      07/11/2016 Surgery    Left mastectomy: ILC grade 2, 6 cm, LCIS, 1/3 lymph nodes micrometastatic disease, ER 80%, PR 90%, HER-2 negative, Ki-67 3%, T3 N0 (i+) stage IIB pathologic stage      08/15/2016 Oncotype testing    Oncotype Dx 6: 7% ROR      09/05/2016 - 10/20/2016 Radiation Therapy     Adjuvant radiation therapy      12/06/2016 -  Anti-estrogen oral therapy    Tamoxifen switched to anastrozole 03/29/2017 (after she became menopausal)       CHIEF COMPLIANT: Follow-up on anastrozole  INTERVAL HISTORY: URI COVEY is a 52 year old with above-mentioned history of left breast cancer treated with mastectomy and radiation is currently on anastrozole.  She complains of joint stiffness and achiness.  She denies any lumps or nodules in the breast or chest wall.   REVIEW OF SYSTEMS:   Constitutional: Denies fevers,  chills or abnormal weight loss Eyes: Denies blurriness of vision Ears, nose, mouth, throat, and face: Denies mucositis or sore throat Respiratory: Denies cough, dyspnea or wheezes Cardiovascular: Denies palpitation, chest discomfort Gastrointestinal:  Denies nausea, heartburn or change in bowel habits Skin: Denies abnormal skin rashes Lymphatics: Denies new lymphadenopathy or easy bruising Neurological:Denies numbness, tingling or new weaknesses Behavioral/Psych: Mood is stable, no new changes  Extremities: No lower extremity edema Breast:  denies any pain or lumps or nodules in either breasts All other systems were reviewed with the patient and are negative.  I have reviewed the past medical history, past surgical history, social history and family history with the patient and they are unchanged from previous note.  ALLERGIES:  has No Known Allergies.  MEDICATIONS:  Current Outpatient Medications  Medication Sig Dispense Refill  . anastrozole (ARIMIDEX) 1 MG tablet Take 1 tablet (1 mg total) by mouth daily. 90 tablet 3  . Calcium Carbonate-Vitamin D (CALCIUM 500 + D) 500-125 MG-UNIT TABS Take 1 tablet by mouth 2 (two) times daily.     No current facility-administered medications for this visit.     PHYSICAL EXAMINATION: ECOG PERFORMANCE STATUS: 1 - Symptomatic but completely ambulatory  Vitals:   12/15/17 0843  BP: 124/82  Pulse: 65  Resp: 17  Temp: 98.1 F (36.7 C)  SpO2: 97%   Filed Weights   12/15/17 0843  Weight: 140 lb 3.2 oz (63.6 kg)    GENERAL:alert, no distress and comfortable SKIN: skin color, texture, turgor are  normal, no rashes or significant lesions EYES: normal, Conjunctiva are pink and non-injected, sclera clear OROPHARYNX:no exudate, no erythema and lips, buccal mucosa, and tongue normal  NECK: supple, thyroid normal size, non-tender, without nodularity LYMPH:  no palpable lymphadenopathy in the cervical, axillary or inguinal LUNGS: clear to  auscultation and percussion with normal breathing effort HEART: regular rate & rhythm and no murmurs and no lower extremity edema ABDOMEN:abdomen soft, non-tender and normal bowel sounds MUSCULOSKELETAL:no cyanosis of digits and no clubbing  NEURO: alert & oriented x 3 with fluent speech, no focal motor/sensory deficits EXTREMITIES: No lower extremity edema BREAST: Left mastectomy no palpable lumps or nodules. (exam performed in the presence of a chaperone)  LABORATORY DATA:  I have reviewed the data as listed CMP Latest Ref Rng & Units 07/06/2016  Glucose 70 - 140 mg/dl 82  BUN 7.0 - 26.0 mg/dL 10.4  Creatinine 0.6 - 1.1 mg/dL 0.7  Sodium 136 - 145 mEq/L 141  Potassium 3.5 - 5.1 mEq/L 3.7  CO2 22 - 29 mEq/L 26  Calcium 8.4 - 10.4 mg/dL 9.6  Total Protein 6.4 - 8.3 g/dL 7.0  Total Bilirubin 0.20 - 1.20 mg/dL 0.50  Alkaline Phos 40 - 150 U/L 55  AST 5 - 34 U/L 18  ALT 0 - 55 U/L 11    Lab Results  Component Value Date   WBC 4.4 07/06/2016   HGB 14.2 07/06/2016   HCT 42.5 07/06/2016   MCV 96.6 07/06/2016   PLT 210 07/06/2016   NEUTROABS 2.2 07/06/2016    ASSESSMENT & PLAN:  Breast cancer of upper-outer quadrant of left female breast ([place]) Left mastectomy 07/11/2016: ILC grade 2, 6 cm, LCIS, 1/3 lymph nodes micrometastatic disease, ER 80%, PR 90%, HER-2 negative, Ki-67 3%, T3 N0 (i+) stage IIB pathologic stage Oncotype DX: 6, 7% ROR  Treatment Plan: Adjuvant antiestrogen therapy Withtamoxifen 20 mg daily until June 2018 (patient's last menstrual cycle was June 2017) andonce she became menopausal she is now on Anastrozole  Anastrozole Toxicities: 1.  Arthralgias and myalgias: I instructed her to take turmeric and drink lots of water.  Surveillance: 1. Breast Exam: 12/14/17 2. Rt Mammogram: To be done July 2019  RTC in 1 year   I spent 25 minutes talking to the patient of which more than half was spent in counseling and coordination of care.  No orders of the defined  types were placed in this encounter.  The patient has a good understanding of the overall plan. she agrees with it. she will call with any problems that may develop before the next visit here.   Harriette Ohara, MD 12/15/17

## 2017-12-29 DIAGNOSIS — H35721 Serous detachment of retinal pigment epithelium, right eye: Secondary | ICD-10-CM | POA: Diagnosis not present

## 2017-12-29 DIAGNOSIS — H5203 Hypermetropia, bilateral: Secondary | ICD-10-CM | POA: Diagnosis not present

## 2018-04-27 IMAGING — MR MR BREAST BILAT WO/W CM
5 of 9 series · 24 of 48 positions shown · IV contrast (multihance)
Comparison: Outside mammography June 15, 2016. Other mammograms
since 3819.

CLINICAL DATA: 3 biopsy proven regions of breast cancer in the left
breast.

LABS:  None
EXAM:
BILATERAL BREAST MRI WITH AND WITHOUT CONTRAST
TECHNIQUE: Multiplanar, multisequence MR images of both breasts were obtained
prior to and following the intravenous administration of 12 ml of
MultiHance.

[Series 2: STIR · axial · 3.0mm · 0.94mm/px · 1 of 60 slices shown]
[im 1/60]
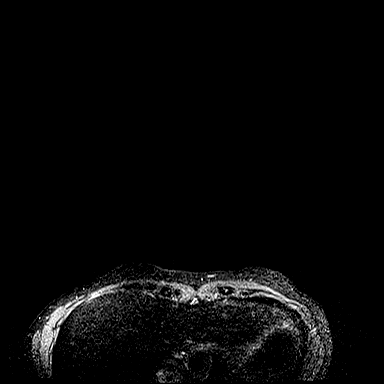

[Series 3: fl3d pre no · axial · non-contrast · 1.0mm · 0.80mm/px · z∈[-109,+98]mm · 5 of 208 slices shown]
[im 1/208]
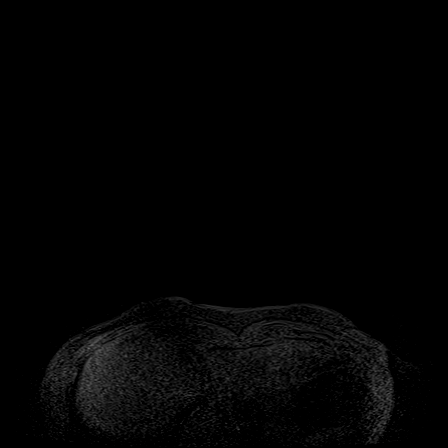
[im 52/208]
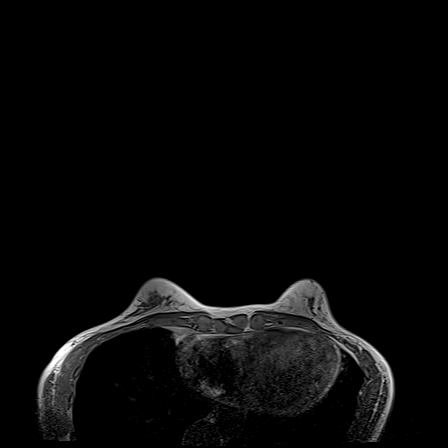
[im 104/208]
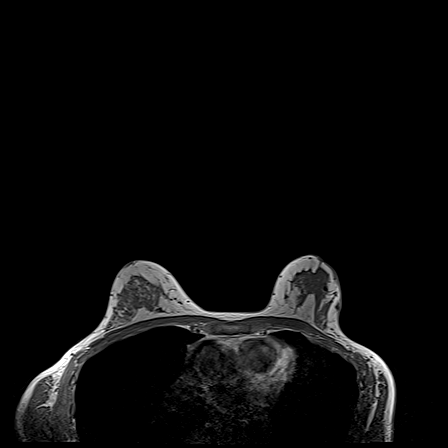
[im 156/208]
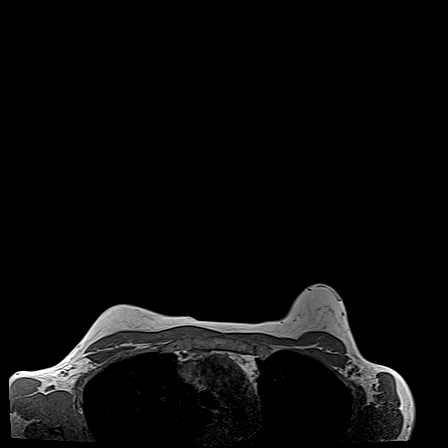
[im 208/208]
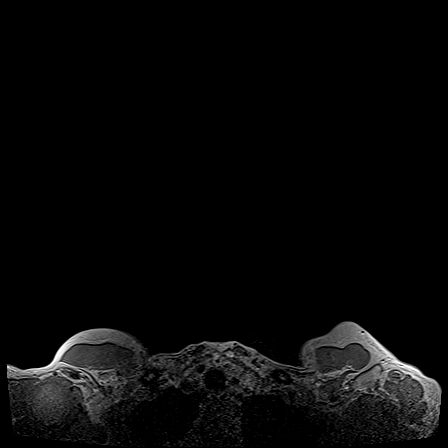

[Series 14: axial pre fs · axial · non-contrast · 1.0mm · 0.80mm/px · z∈[-95,+112]mm · 6 of 208 slices shown]
[im 1/208]
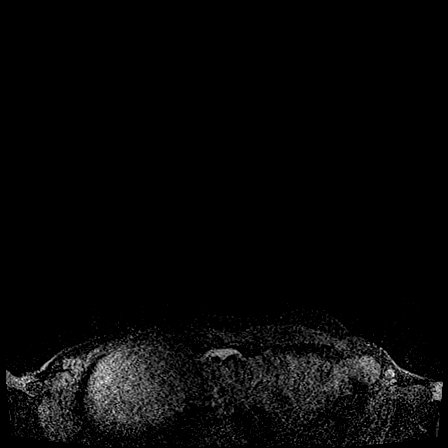
[im 42/208]
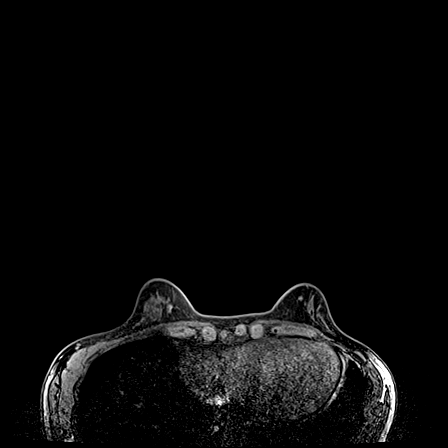
[im 83/208]
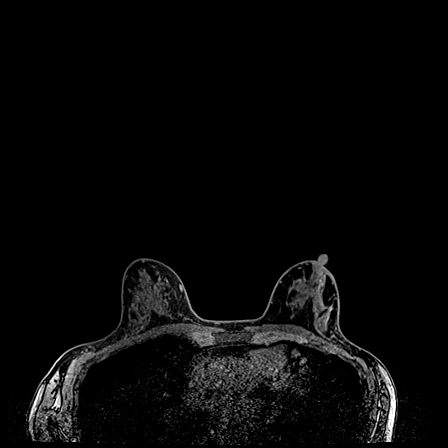
[im 125/208]
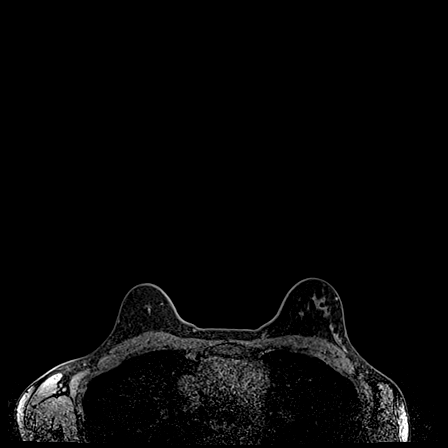
[im 166/208]
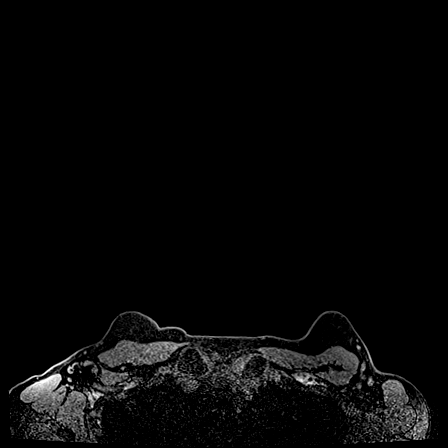
[im 208/208]
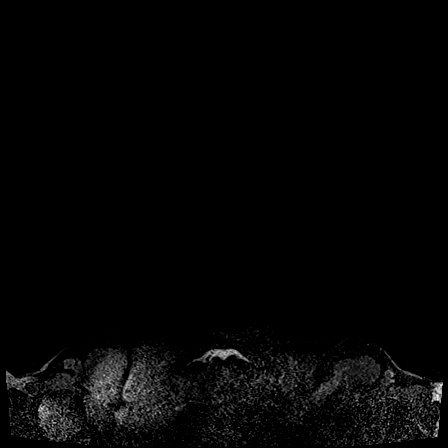

[Series 15: axial post 20 · axial · 1.0mm · 0.80mm/px · z∈[-95,+112]mm · 6 of 208 slices shown]
[im 1/208]
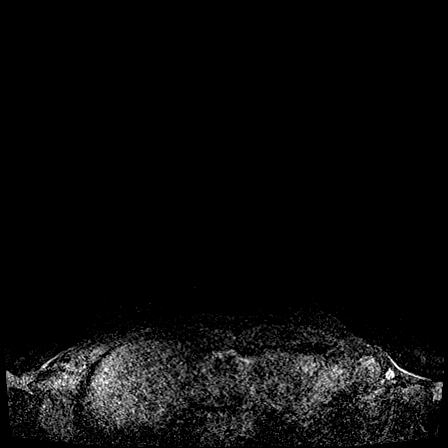
[im 42/208]
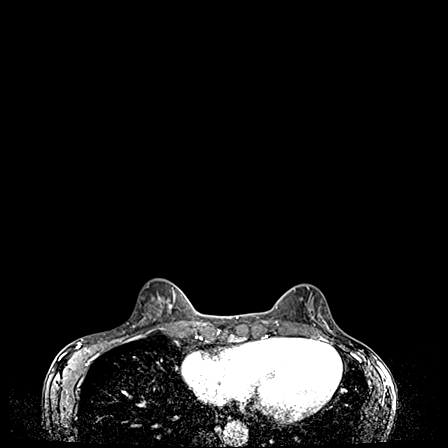
[im 83/208]
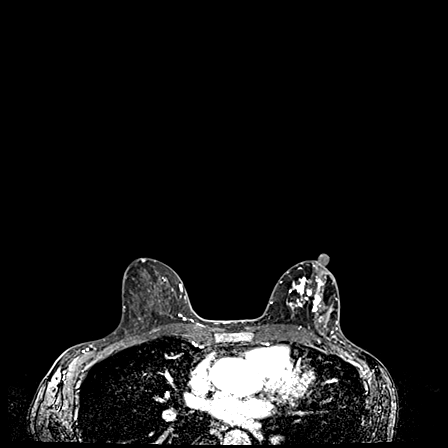
[im 125/208]
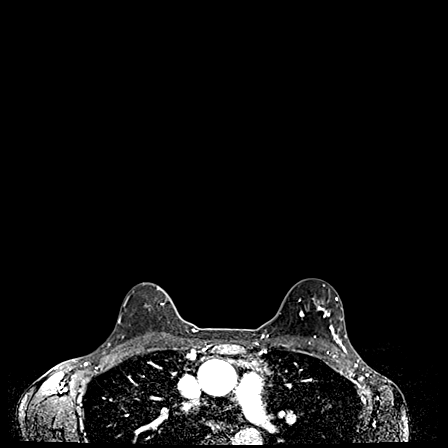
[im 166/208]
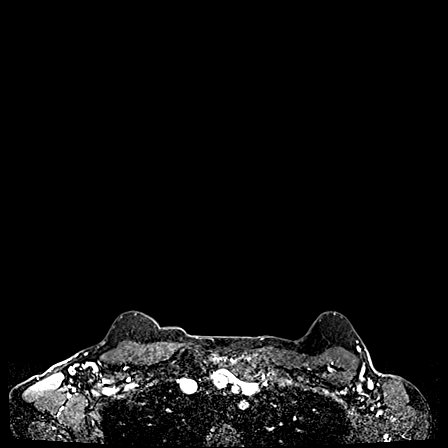
[im 208/208]
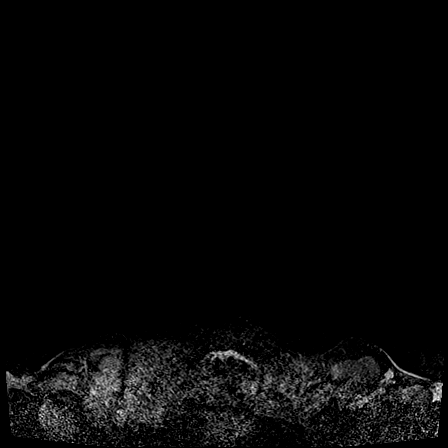

[Series 16: axial post 3 · axial · 1.0mm · 0.80mm/px · z∈[-95,+112]mm · 6 of 208 slices shown]
[im 1/208]
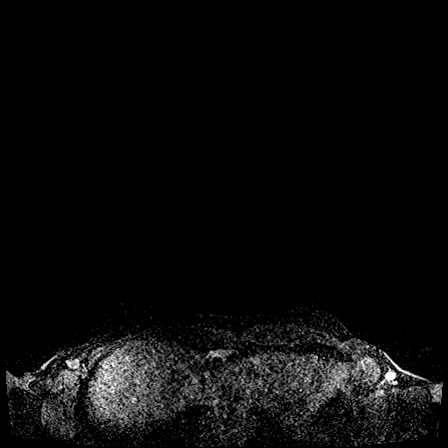
[im 42/208]
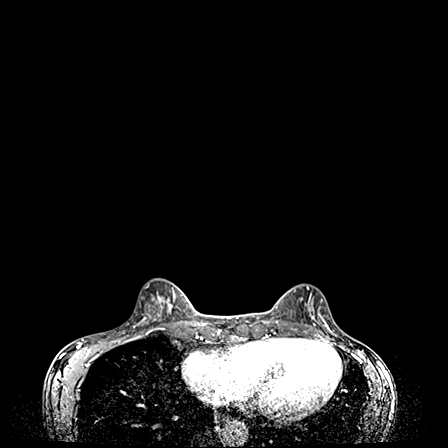
[im 83/208]
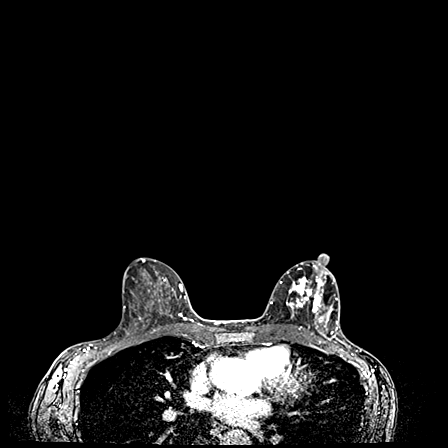
[im 125/208]
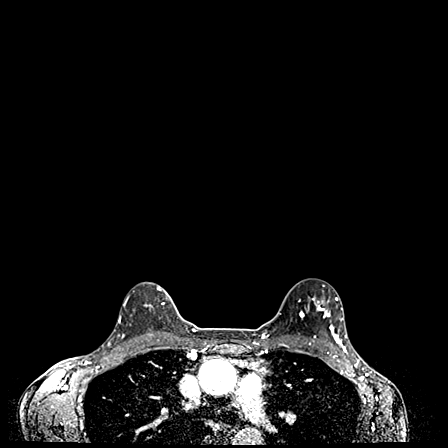
[im 166/208]
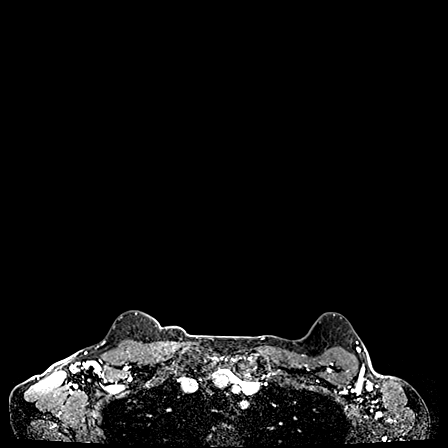
[im 208/208]
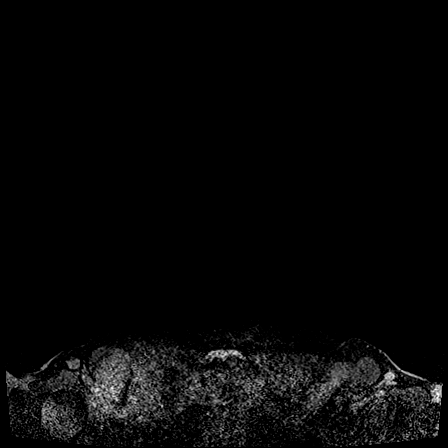

[24 of 48 positions shown; findings below may reference images not displayed]

THREE-DIMENSIONAL MR IMAGE RENDERING ON INDEPENDENT WORKSTATION:

Three-dimensional MR images were rendered by post-processing of the
original MR data on an independent workstation. The
three-dimensional MR images were interpreted, and findings are
reported in the following complete MRI report for this study. Three
dimensional images were evaluated at the independent [employer]
workstation
FINDINGS: Breast composition: Heterogeneously dense glandular tissue

Background parenchymal enhancement: Moderate.

Right breast: No MRI evidence of malignancy in the right breast. A
few mildly prominent vessels are seen posteriorly in the medial
right breast. No suspicious masses or non mass enhancement.

Left breast: Extensive malignancy is seen in the left breast. The
most anterior biopsy clip is in the largest mass which measures
x 3.0 cm in transverse an AP dimensions. The mass is largely
centered above the nipple but does send finger-like projections
inferior to the nipple. Anteriorly, the mass extends to the
undersurface of the skin with no definitive skin thickening. There
is a small amount of enhancement in the skin as seen on series 17,
image 113. The primary mass is also centered laterally but does
extend across midline into the medial aspect of the breast. There
are multiple enhancing nodules surround the primary mass. An
enhancing nodule superiorly in the left breast on series 17, image
81 is 1 of the patient's known biopsied malignancies measuring 8 mm.
Another enhancing nodule posteriorly in the lateral superior left
breast represents another of the patient's known biopsy-proven
malignancies measuring 10 mm. Multiple other enhancing nodules are
are seen between the main mass and the 2 other biopsy proven
malignancies. There is also non mass enhancement extending
posteriorly in the breast on series 17, image 107 spanning at least
3.5 cm. Multiple enhancing nodules also are seen more inferiorly in
the breasts, also worrisome for satellite lesions. The most inferior
enhancing nodule is seen on series 7, image 150 measuring 5 mm. The
total extent of expected malignancy counting apparent surrounding
satellite lesions is 7.2 cm in cranial caudal dimension, 4.9 cm an
AP dimension and 4.1 cm in transverse dimension.

Lymph nodes: No abnormal appearing lymph nodes.

Ancillary findings:  None.
IMPRESSION: 1. Extensive malignancy is seen in the left breast. Based on biopsy
proven malignancies, the greatest span of disease is at least
cm. However, there are numerous surrounding enhancing nodules most
consistent with satellite lesions. The span of malignancy is 7.2 x
4.9 x 4.1 cm in cranial caudal, AP, and transverse dimensions when
including the apparent satellite disease. Additionally, the
malignancy is multi centric involving the upper outer, upper inner,
and lower inner breast.
2. No MRI evidence of malignancy in the right breast.

RECOMMENDATION:
No further biopsies are necessary unless the patient is considering
breast conservation. If the patient is considering breast
conservation, MRI biopsies could be utilized to confirm extent of
disease.

BI-RADS CATEGORY  6: Known biopsy-proven malignancy.

## 2018-06-26 DIAGNOSIS — M79641 Pain in right hand: Secondary | ICD-10-CM | POA: Diagnosis not present

## 2018-06-26 DIAGNOSIS — M79642 Pain in left hand: Secondary | ICD-10-CM | POA: Diagnosis not present

## 2018-06-27 DIAGNOSIS — Z853 Personal history of malignant neoplasm of breast: Secondary | ICD-10-CM | POA: Diagnosis not present

## 2018-06-27 DIAGNOSIS — R922 Inconclusive mammogram: Secondary | ICD-10-CM | POA: Diagnosis not present

## 2018-08-15 DIAGNOSIS — C50412 Malignant neoplasm of upper-outer quadrant of left female breast: Secondary | ICD-10-CM | POA: Diagnosis not present

## 2018-09-05 DIAGNOSIS — Z1212 Encounter for screening for malignant neoplasm of rectum: Secondary | ICD-10-CM | POA: Diagnosis not present

## 2018-09-05 DIAGNOSIS — Z1211 Encounter for screening for malignant neoplasm of colon: Secondary | ICD-10-CM | POA: Diagnosis not present

## 2018-12-06 ENCOUNTER — Encounter (INDEPENDENT_AMBULATORY_CARE_PROVIDER_SITE_OTHER): Payer: BLUE CROSS/BLUE SHIELD | Admitting: Ophthalmology

## 2018-12-06 DIAGNOSIS — Z803 Family history of malignant neoplasm of breast: Secondary | ICD-10-CM | POA: Diagnosis not present

## 2018-12-06 DIAGNOSIS — Z853 Personal history of malignant neoplasm of breast: Secondary | ICD-10-CM | POA: Diagnosis not present

## 2018-12-06 DIAGNOSIS — N950 Postmenopausal bleeding: Secondary | ICD-10-CM | POA: Diagnosis not present

## 2018-12-06 DIAGNOSIS — Z113 Encounter for screening for infections with a predominantly sexual mode of transmission: Secondary | ICD-10-CM | POA: Diagnosis not present

## 2018-12-06 DIAGNOSIS — Z01419 Encounter for gynecological examination (general) (routine) without abnormal findings: Secondary | ICD-10-CM | POA: Diagnosis not present

## 2018-12-06 DIAGNOSIS — Z6822 Body mass index (BMI) 22.0-22.9, adult: Secondary | ICD-10-CM | POA: Diagnosis not present

## 2018-12-06 DIAGNOSIS — Z124 Encounter for screening for malignant neoplasm of cervix: Secondary | ICD-10-CM | POA: Diagnosis not present

## 2018-12-11 NOTE — Progress Notes (Signed)
Patient Care Team: Merrilee Seashore, MD as PCP - General (Internal Medicine) Rolm Bookbinder, MD as Consulting Physician (General Surgery) Nicholas Lose, MD as Consulting Physician (Hematology and Oncology) Kyung Rudd, MD as Consulting Physician (Radiation Oncology) Gardenia Phlegm, NP as Nurse Practitioner (Hematology and Oncology)  DIAGNOSIS:    ICD-10-CM   1. Malignant neoplasm of upper-outer quadrant of left breast in female, estrogen receptor positive ([place]) C50.412    Z17.0     SUMMARY OF ONCOLOGIC HISTORY:   Breast cancer of upper-outer quadrant of left female breast ([place])   06/22/2016 Initial Diagnosis    Left breast biopsies subareolar: 3 biopsies showing multifocal invasive lobular carcinoma with LCIS grade 2 with lymphovascular invasion, ER 80%, PR 90%, Ki-67 3%, HER-2 negative ratio 1.13    07/05/2016 Breast MRI    Left breast biopsy 7:00: Grade 2 invasive ductal carcinoma, ER 95%, PR 95%, Ki-67 15%, HER-2 negative ratio 1.48; left breast distortion 1.2 x 1.2 x 0.8 cm; left breast cyst 1.1 cm, T1b N0 stage IA clinical stage    07/11/2016 Surgery    Left mastectomy: ILC grade 2, 6 cm, LCIS, 1/3 lymph nodes micrometastatic disease, ER 80%, PR 90%, HER-2 negative, Ki-67 3%, T3 N0 (i+) stage IIB pathologic stage    08/15/2016 Oncotype testing    Oncotype Dx 6: 7% ROR    09/05/2016 - 10/20/2016 Radiation Therapy     Adjuvant radiation therapy    12/06/2016 -  Anti-estrogen oral therapy    Tamoxifen switched to anastrozole 03/29/2017 (after she became menopausal)     CHIEF COMPLIANT: Follow-up of anastrozole  INTERVAL HISTORY: Stephanie Dennis is a 1 y.o. with above-mentioned history of left breast cancer treated with mastectomy and radiation and is currently on anastrozole. I last saw the patient one year ago. Her most recent mammogram on 06/27/18 showed no evidence of malignancy. She presents to the clinic alone today and is doing well. She reports mild aches and  pains in muscles and joints, occasional swelling in her right wrist for which she got an injection, and occasional hot flashes. She reports she had not had a menstrual cycle since starting treatment but had a light cycle in November, for which she saw her gynecologist and is getting and [place]. She denies any pain or discomfort in her breasts. She reports trying turmeric but it was not effective. She reports she has not been taking calcium pills lately. She has going to the gym 5-6 times per week since September. She will get her next bone scan in April.   REVIEW OF SYSTEMS:   Constitutional: Denies fevers, chills or abnormal weight loss (+) occasional hot flashes Eyes: Denies blurriness of vision Ears, nose, mouth, throat, and face: Denies mucositis or sore throat Respiratory: Denies cough, dyspnea or wheezes Cardiovascular: Denies palpitation, chest discomfort Gastrointestinal:  Denies nausea, heartburn or change in bowel habits Skin: Denies abnormal skin rashes MSK: (+) muscle and joint aches and pains GYN: (+) abnormal menstrual cycles Lymphatics: Denies new lymphadenopathy or easy bruising Neurological: Denies numbness, tingling or new weaknesses Behavioral/Psych: Mood is stable, no new changes  Extremities: (+) occasional swelling in right wrist Breast: denies any pain or lumps or nodules in either breasts All other systems were reviewed with the patient and are negative.  I have reviewed the past medical history, past surgical history, social history and family history with the patient and they are unchanged from previous note.  ALLERGIES:  has No Known Allergies.  MEDICATIONS:  Current  Outpatient Medications  Medication Sig Dispense Refill  . anastrozole (ARIMIDEX) 1 MG tablet Take 1 tablet (1 mg total) by mouth daily. 90 tablet 3  . Calcium Carbonate-Vitamin D (CALCIUM 500 + D) 500-125 MG-UNIT TABS Take 1 tablet by mouth 2 (two) times daily.     No current facility-administered  medications for this visit.     PHYSICAL EXAMINATION: ECOG PERFORMANCE STATUS: 1 - Symptomatic but completely ambulatory  Vitals:   12/13/18 0840  BP: 115/73  Pulse: 61  Resp: 17  Temp: 98.3 F (36.8 C)  SpO2: 100%   Filed Weights   12/13/18 0840  Weight: 140 lb (63.5 kg)    GENERAL:alert, no distress and comfortable SKIN: skin color, texture, turgor are normal, no rashes or significant lesions EYES: normal, Conjunctiva are pink and non-injected, sclera clear OROPHARYNX:no exudate, no erythema and lips, buccal mucosa, and tongue normal  NECK: supple, thyroid normal size, non-tender, without nodularity LYMPH:  no palpable lymphadenopathy in the cervical, axillary or inguinal LUNGS: clear to auscultation and percussion with normal breathing effort HEART: regular rate & rhythm and no murmurs and no lower extremity edema ABDOMEN:abdomen soft, non-tender and normal bowel sounds MUSCULOSKELETAL:no cyanosis of digits and no clubbing  NEURO: alert & oriented x 3 with fluent speech, no focal motor/sensory deficits EXTREMITIES: No lower extremity edema BREAST: No palpable masses or nodules in  right  Breast.  Left mastectomy scar is intact without any lumps or nodules in the left chest wall or axilla. (exam performed in the presence of a chaperone)  LABORATORY DATA:  I have reviewed the data as listed CMP Latest Ref Rng & Units 07/06/2016  Glucose 70 - 140 mg/dl 82  BUN 7.0 - 26.0 mg/dL 10.4  Creatinine 0.6 - 1.1 mg/dL 0.7  Sodium 136 - 145 mEq/L 141  Potassium 3.5 - 5.1 mEq/L 3.7  CO2 22 - 29 mEq/L 26  Calcium 8.4 - 10.4 mg/dL 9.6  Total Protein 6.4 - 8.3 g/dL 7.0  Total Bilirubin 0.20 - 1.20 mg/dL 0.50  Alkaline Phos 40 - 150 U/L 55  AST 5 - 34 U/L 18  ALT 0 - 55 U/L 11    Lab Results  Component Value Date   WBC 4.4 07/06/2016   HGB 14.2 07/06/2016   HCT 42.5 07/06/2016   MCV 96.6 07/06/2016   PLT 210 07/06/2016   NEUTROABS 2.2 07/06/2016    ASSESSMENT & PLAN:    Breast cancer of upper-outer quadrant of left female breast ([place]) Left mastectomy 07/11/2016: ILC grade 2, 6 cm, LCIS, 1/3 lymph nodes micrometastatic disease, ER 80%, PR 90%, HER-2 negative, Ki-67 3%, T3 N0 (i+) stage IIB pathologic stage Oncotype DX: 6, 7% ROR  Treatment Plan: Adjuvant antiestrogen therapy Withtamoxifen 20 mg daily until June 2018 (patient's last menstrual cycle was June 2017) andonce she became menopausal she is now on Anastrozole  Anastrozole Toxicities: 1.  Arthralgias and myalgias: Continues to have intermittent problems and had received an injection in the wrist with improvement in pain and discomfort. 2. Intermittent hot flashes that are fairly mild  Surveillance: 1. Breast Exam: 12/13/2018: Benign 2. Rt Mammogram: 06/27/2018 at [facility]: Benign    RTC in 1 year    No orders of the defined types were placed in this encounter.  The patient has a good understanding of the overall plan. she agrees with it. she will call with any problems that may develop before the next visit here.  Nicholas Lose, MD 12/13/2018   Julious Oka Dorshimer,  am acting as scribe for Nicholas Lose, MD.  I have reviewed the above documentation for accuracy and completeness, and I agree with the above.

## 2018-12-12 ENCOUNTER — Encounter (INDEPENDENT_AMBULATORY_CARE_PROVIDER_SITE_OTHER): Payer: BLUE CROSS/BLUE SHIELD | Admitting: Ophthalmology

## 2018-12-13 ENCOUNTER — Encounter (INDEPENDENT_AMBULATORY_CARE_PROVIDER_SITE_OTHER): Payer: BLUE CROSS/BLUE SHIELD | Admitting: Ophthalmology

## 2018-12-13 ENCOUNTER — Telehealth: Payer: Self-pay | Admitting: Hematology and Oncology

## 2018-12-13 ENCOUNTER — Other Ambulatory Visit: Payer: Self-pay | Admitting: Hematology and Oncology

## 2018-12-13 ENCOUNTER — Inpatient Hospital Stay: Payer: BLUE CROSS/BLUE SHIELD | Attending: Hematology and Oncology | Admitting: Hematology and Oncology

## 2018-12-13 DIAGNOSIS — M791 Myalgia, unspecified site: Secondary | ICD-10-CM | POA: Diagnosis not present

## 2018-12-13 DIAGNOSIS — C50412 Malignant neoplasm of upper-outer quadrant of left female breast: Secondary | ICD-10-CM | POA: Diagnosis not present

## 2018-12-13 DIAGNOSIS — M2550 Pain in unspecified joint: Secondary | ICD-10-CM | POA: Diagnosis not present

## 2018-12-13 DIAGNOSIS — N951 Menopausal and female climacteric states: Secondary | ICD-10-CM

## 2018-12-13 DIAGNOSIS — H353112 Nonexudative age-related macular degeneration, right eye, intermediate dry stage: Secondary | ICD-10-CM

## 2018-12-13 DIAGNOSIS — H43813 Vitreous degeneration, bilateral: Secondary | ICD-10-CM | POA: Diagnosis not present

## 2018-12-13 DIAGNOSIS — Z9012 Acquired absence of left breast and nipple: Secondary | ICD-10-CM

## 2018-12-13 DIAGNOSIS — Z170 Estrogen receptor positive status [ER+]: Secondary | ICD-10-CM | POA: Diagnosis not present

## 2018-12-13 DIAGNOSIS — H2513 Age-related nuclear cataract, bilateral: Secondary | ICD-10-CM | POA: Diagnosis not present

## 2018-12-13 MED ORDER — ANASTROZOLE 1 MG PO TABS: 1.0000 mg | ORAL_TABLET | Freq: Every day | ORAL | 3 refills | Status: DC

## 2018-12-13 NOTE — Assessment & Plan Note (Signed)
Left mastectomy 07/11/2016: ILC grade 2, 6 cm, LCIS, 1/3 lymph nodes micrometastatic disease, ER 80%, PR 90%, HER-2 negative, Ki-67 3%, T3 N0 (i+) stage IIB pathologic stage Oncotype DX: 6, 7% ROR  Treatment Plan: Adjuvant antiestrogen therapy Withtamoxifen 20 mg daily until June 2018 (patient's last menstrual cycle was June 2017) andonce she became menopausal she is now on Anastrozole  Anastrozole Toxicities: 1.  Arthralgias and myalgias: I instructed her to take turmeric and drink lots of water.  Surveillance: 1. Breast Exam: 12/13/2018 2. Rt Mammogram: 06/27/2018 at [facility]: Benign    RTC in 1 year

## 2018-12-13 NOTE — Telephone Encounter (Signed)
Gave avs and calendar ° °

## 2018-12-28 DIAGNOSIS — N950 Postmenopausal bleeding: Secondary | ICD-10-CM | POA: Diagnosis not present

## 2019-04-11 DIAGNOSIS — M8589 Other specified disorders of bone density and structure, multiple sites: Secondary | ICD-10-CM | POA: Diagnosis not present

## 2019-04-11 DIAGNOSIS — Z780 Asymptomatic menopausal state: Secondary | ICD-10-CM | POA: Diagnosis not present

## 2019-07-11 DIAGNOSIS — Z79899 Other long term (current) drug therapy: Secondary | ICD-10-CM | POA: Diagnosis not present

## 2019-08-02 DIAGNOSIS — Z1231 Encounter for screening mammogram for malignant neoplasm of breast: Secondary | ICD-10-CM | POA: Diagnosis not present

## 2019-08-02 DIAGNOSIS — Z853 Personal history of malignant neoplasm of breast: Secondary | ICD-10-CM | POA: Diagnosis not present

## 2019-12-08 ENCOUNTER — Other Ambulatory Visit: Payer: Self-pay | Admitting: Hematology and Oncology

## 2019-12-15 NOTE — Progress Notes (Signed)
Patient Care Team: Merrilee Seashore, MD as PCP - General (Internal Medicine) Rolm Bookbinder, MD as Consulting Physician (General Surgery) Nicholas Lose, MD as Consulting Physician (Hematology and Oncology) Kyung Rudd, MD as Consulting Physician (Radiation Oncology) Gardenia Phlegm, NP as Nurse Practitioner (Hematology and Oncology)  DIAGNOSIS:    ICD-10-CM   1. Malignant neoplasm of upper-outer quadrant of left breast in female, estrogen receptor positive ([place])  C50.412    Z17.0     SUMMARY OF ONCOLOGIC HISTORY: Oncology History  Breast cancer of upper-outer quadrant of left female breast ([place])  06/22/2016 Initial Diagnosis   Left breast biopsies subareolar: 3 biopsies showing multifocal invasive lobular carcinoma with LCIS grade 2 with lymphovascular invasion, ER 80%, PR 90%, Ki-67 3%, HER-2 negative ratio 1.13   07/05/2016 Breast MRI   Left breast biopsy 7:00: Grade 2 invasive ductal carcinoma, ER 95%, PR 95%, Ki-67 15%, HER-2 negative ratio 1.48; left breast distortion 1.2 x 1.2 x 0.8 cm; left breast cyst 1.1 cm, T1b N0 stage IA clinical stage   07/11/2016 Surgery   Left mastectomy: ILC grade 2, 6 cm, LCIS, 1/3 lymph nodes micrometastatic disease, ER 80%, PR 90%, HER-2 negative, Ki-67 3%, T3 N0 (i+) stage IIB pathologic stage   08/15/2016 Oncotype testing   Oncotype Dx 6: 7% ROR   09/05/2016 - 10/20/2016 Radiation Therapy    Adjuvant radiation therapy   12/06/2016 -  Anti-estrogen oral therapy   Tamoxifen switched to anastrozole 03/29/2017 (after she became menopausal)     CHIEF COMPLIANT: Follow-up of left breast cancer on anastrozole  INTERVAL HISTORY: Stephanie Dennis is a 54 y.o. with above-mentioned history of left breast cancer treated with mastectomy, radiation, and who is currently on anastrozole. She presents to the clinic today for annual follow-up.  She does have aches and pains but they have improved with vitamin D replacement therapy.  ALLERGIES:   has No Known Allergies.  MEDICATIONS:  Current Outpatient Medications  Medication Sig Dispense Refill  . anastrozole (ARIMIDEX) 1 MG tablet TAKE 1 TABLET BY MOUTH EVERY DAY 90 tablet 2  . anastrozole (ARIMIDEX) 1 MG tablet TAKE 1 TABLET BY MOUTH EVERY DAY 90 tablet 0  . Calcium Carbonate-Vitamin D (CALCIUM 500 + D) 500-125 MG-UNIT TABS Take 1 tablet by mouth 2 (two) times daily.     No current facility-administered medications for this visit.    PHYSICAL EXAMINATION: ECOG PERFORMANCE STATUS: 1 - Symptomatic but completely ambulatory  Vitals:   12/16/19 0920  BP: 127/83  Pulse: 68  Resp: 20  Temp: 97.9 F (36.6 C)  SpO2: 99%   Filed Weights   12/16/19 0920  Weight: 144 lb (65.3 kg)    BREAST: No palpable masses or nodules in right breast or left chest wall. No palpable axillary supraclavicular or infraclavicular adenopathy no breast tenderness or nipple discharge. (exam performed in the presence of a chaperone)  LABORATORY DATA:  I have reviewed the data as listed CMP Latest Ref Rng & Units 07/06/2016  Glucose 70 - 140 mg/dl 82  BUN 7.0 - 26.0 mg/dL 10.4  Creatinine 0.6 - 1.1 mg/dL 0.7  Sodium 136 - 145 mEq/L 141  Potassium 3.5 - 5.1 mEq/L 3.7  CO2 22 - 29 mEq/L 26  Calcium 8.4 - 10.4 mg/dL 9.6  Total Protein 6.4 - 8.3 g/dL 7.0  Total Bilirubin 0.20 - 1.20 mg/dL 0.50  Alkaline Phos 40 - 150 U/L 55  AST 5 - 34 U/L 18  ALT 0 - 55 U/L 11  Lab Results  Component Value Date   WBC 4.4 07/06/2016   HGB 14.2 07/06/2016   HCT 42.5 07/06/2016   MCV 96.6 07/06/2016   PLT 210 07/06/2016   NEUTROABS 2.2 07/06/2016    ASSESSMENT & PLAN:  Breast cancer of upper-outer quadrant of left female breast ([place]) Left mastectomy 07/11/2016: ILC grade 2, 6 cm, LCIS, 1/3 lymph nodes micrometastatic disease, ER 80%, PR 90%, HER-2 negative, Ki-67 3%, T3 N0 (i+) stage IIB pathologic stage Oncotype DX: 6, 7% ROR  Treatment Plan: Adjuvant antiestrogen therapy Withtamoxifen 20 mg  daily until June 2018 (patient's last menstrual cycle was June 2017) andonce she becamemenopausal she is now on Anastrozole  Anastrozole Toxicities: 1.Arthralgias and myalgias: Much improved with vitamin D replacement therapy 2.  Mild hot flashes: Being observed  Surveillance: 1. Breast Exam:  12/16/2019: Benign 2. Rt Mammogram: 06/27/2018 at [facility]: Benign  3.  Bone density at [facility] May 2020: T score -2.6: Osteoporosis Recommended bisphosphonate along with calcium and vitamin D   RTC in 1 year    No orders of the defined types were placed in this encounter.  The patient has a good understanding of the overall plan. she agrees with it. she will call with any problems that may develop before the next visit here.  Total time spent: 15 mins including face to face time and time spent for planning, charting and coordination of care  Nicholas Lose, MD 12/16/2019  I, Cloyde Reams Dorshimer, am acting as scribe for Dr. Nicholas Lose.  I have reviewed the above documentation for accuracy and completeness, and I agree with the above.

## 2019-12-16 ENCOUNTER — Other Ambulatory Visit: Payer: Self-pay

## 2019-12-16 ENCOUNTER — Telehealth: Payer: Self-pay | Admitting: Hematology and Oncology

## 2019-12-16 ENCOUNTER — Encounter (INDEPENDENT_AMBULATORY_CARE_PROVIDER_SITE_OTHER): Payer: BC Managed Care – PPO | Admitting: Ophthalmology

## 2019-12-16 ENCOUNTER — Inpatient Hospital Stay: Payer: BC Managed Care – PPO | Attending: Hematology and Oncology | Admitting: Hematology and Oncology

## 2019-12-16 DIAGNOSIS — M810 Age-related osteoporosis without current pathological fracture: Secondary | ICD-10-CM | POA: Diagnosis not present

## 2019-12-16 DIAGNOSIS — Z170 Estrogen receptor positive status [ER+]: Secondary | ICD-10-CM | POA: Diagnosis not present

## 2019-12-16 DIAGNOSIS — H353112 Nonexudative age-related macular degeneration, right eye, intermediate dry stage: Secondary | ICD-10-CM

## 2019-12-16 DIAGNOSIS — C773 Secondary and unspecified malignant neoplasm of axilla and upper limb lymph nodes: Secondary | ICD-10-CM | POA: Insufficient documentation

## 2019-12-16 DIAGNOSIS — C50412 Malignant neoplasm of upper-outer quadrant of left female breast: Secondary | ICD-10-CM | POA: Insufficient documentation

## 2019-12-16 DIAGNOSIS — H43813 Vitreous degeneration, bilateral: Secondary | ICD-10-CM

## 2019-12-16 DIAGNOSIS — Z9012 Acquired absence of left breast and nipple: Secondary | ICD-10-CM | POA: Insufficient documentation

## 2019-12-16 DIAGNOSIS — Z923 Personal history of irradiation: Secondary | ICD-10-CM | POA: Insufficient documentation

## 2019-12-16 DIAGNOSIS — H2513 Age-related nuclear cataract, bilateral: Secondary | ICD-10-CM

## 2019-12-16 MED ORDER — ANASTROZOLE 1 MG PO TABS: 1.0000 mg | ORAL_TABLET | Freq: Every day | ORAL | 3 refills | Status: DC

## 2019-12-16 NOTE — Assessment & Plan Note (Signed)
Left mastectomy 07/11/2016: ILC grade 2, 6 cm, LCIS, 1/3 lymph nodes micrometastatic disease, ER 80%, PR 90%, HER-2 negative, Ki-67 3%, T3 N0 (i+) stage IIB pathologic stage Oncotype DX: 6, 7% ROR  Treatment Plan: Adjuvant antiestrogen therapy Withtamoxifen 20 mg daily until June 2018 (patient's last menstrual cycle was June 2017) andonce she becamemenopausal she is now on Anastrozole  Anastrozole Toxicities: 1.Arthralgias and myalgias: Previously received injection 2.  Mild hot flashes: Being observed  Surveillance: 1. Breast Exam:  12/16/2019: Benign 2. Rt Mammogram: 06/27/2018 at [facility]: Benign  3.  Bone density at [facility] May 2020: T score -2.6: Osteoporosis Recommended bisphosphonate along with calcium and vitamin D   RTC in 1 year

## 2019-12-16 NOTE — Telephone Encounter (Signed)
Returned patient's phone call regarding rescheduling 12/15/20 appointment, per patient's request appointment has moved to 01/13.

## 2019-12-16 NOTE — Telephone Encounter (Signed)
I left a message regarding schedule  

## 2020-08-07 DIAGNOSIS — Z1231 Encounter for screening mammogram for malignant neoplasm of breast: Secondary | ICD-10-CM | POA: Diagnosis not present

## 2020-12-15 ENCOUNTER — Ambulatory Visit: Payer: BC Managed Care – PPO | Admitting: Hematology and Oncology

## 2020-12-16 NOTE — Progress Notes (Signed)
Patient Care Team: Merrilee Seashore, MD as PCP - General (Internal Medicine) Rolm Bookbinder, MD as Consulting Physician (General Surgery) Nicholas Lose, MD as Consulting Physician (Hematology and Oncology) Kyung Rudd, MD as Consulting Physician (Radiation Oncology) Gardenia Phlegm, NP as Nurse Practitioner (Hematology and Oncology)  DIAGNOSIS:    ICD-10-CM   1. Malignant neoplasm of upper-outer quadrant of left breast in female, estrogen receptor positive ([place])  C50.412    Z17.0     SUMMARY OF ONCOLOGIC HISTORY: Oncology History  Breast cancer of upper-outer quadrant of left female breast ([place])  06/22/2016 Initial Diagnosis   Left breast biopsies subareolar: 3 biopsies showing multifocal invasive lobular carcinoma with LCIS grade 2 with lymphovascular invasion, ER 80%, PR 90%, Ki-67 3%, HER-2 negative ratio 1.13   07/05/2016 Breast MRI   Left breast biopsy 7:00: Grade 2 invasive ductal carcinoma, ER 95%, PR 95%, Ki-67 15%, HER-2 negative ratio 1.48; left breast distortion 1.2 x 1.2 x 0.8 cm; left breast cyst 1.1 cm, T1b N0 stage IA clinical stage   07/11/2016 Surgery   Left mastectomy: ILC grade 2, 6 cm, LCIS, 1/3 lymph nodes micrometastatic disease, ER 80%, PR 90%, HER-2 negative, Ki-67 3%, T3 N0 (i+) stage IIB pathologic stage   08/15/2016 Oncotype testing   Oncotype Dx 6: 7% ROR   09/05/2016 - 10/20/2016 Radiation Therapy    Adjuvant radiation therapy   12/06/2016 -  Anti-estrogen oral therapy   Tamoxifen switched to anastrozole 03/29/2017 (after she became menopausal)     CHIEF COMPLIANT: Follow-up of left breast cancer on anastrozole  INTERVAL HISTORY: Stephanie Dennis is a 55 y.o. with above-mentioned history of left breast cancer treated with mastectomy, radiation,and whois currently on anastrozole.She presents to the clinictoday for annual follow-up.    ALLERGIES:  has No Known Allergies.  MEDICATIONS:  Current Outpatient Medications  Medication Sig  Dispense Refill   anastrozole (ARIMIDEX) 1 MG tablet Take 1 tablet (1 mg total) by mouth daily. 90 tablet 3   Calcium Carbonate-Vitamin D (CALCIUM 500 + D) 500-125 MG-UNIT TABS Take 1 tablet by mouth 2 (two) times daily.     No current facility-administered medications for this visit.    PHYSICAL EXAMINATION: ECOG PERFORMANCE STATUS: 1 - Symptomatic but completely ambulatory  Vitals:   12/17/20 0858  BP: 122/72  Pulse: 69  Resp: 18  Temp: 97.7 F (36.5 C)  SpO2: 100%   Filed Weights   12/17/20 0858  Weight: 145 lb 4.8 oz (65.9 kg)    BREAST: No palpable masses or nodules in either right or left breasts. No palpable axillary supraclavicular or infraclavicular adenopathy no breast tenderness or nipple discharge. (exam performed in the presence of a chaperone)  LABORATORY DATA:  I have reviewed the data as listed CMP Latest Ref Rng & Units 07/06/2016  Glucose 70 - 140 mg/dl 82  BUN 7.0 - 26.0 mg/dL 10.4  Creatinine 0.6 - 1.1 mg/dL 0.7  Sodium 136 - 145 mEq/L 141  Potassium 3.5 - 5.1 mEq/L 3.7  CO2 22 - 29 mEq/L 26  Calcium 8.4 - 10.4 mg/dL 9.6  Total Protein 6.4 - 8.3 g/dL 7.0  Total Bilirubin 0.20 - 1.20 mg/dL 0.50  Alkaline Phos 40 - 150 U/L 55  AST 5 - 34 U/L 18  ALT 0 - 55 U/L 11    Lab Results  Component Value Date   WBC 4.4 07/06/2016   HGB 14.2 07/06/2016   HCT 42.5 07/06/2016   MCV 96.6 07/06/2016   PLT  210 07/06/2016   NEUTROABS 2.2 07/06/2016    ASSESSMENT & PLAN:  Breast cancer of upper-outer quadrant of left female breast ([place]) Left mastectomy 07/11/2016: ILC grade 2, 6 cm, LCIS, 1/3 lymph nodes micrometastatic disease, ER 80%, PR 90%, HER-2 negative, Ki-67 3%, T3 N0 (i+) stage IIB pathologic stage Oncotype DX: 6, 7% ROR  Treatment Plan: Adjuvant antiestrogen therapy Withtamoxifen 20 mg daily until June 2018 (patient's last menstrual cycle was June 2017) andonce she becamemenopausal she is now on Anastrozole  Anastrozole  Toxicities: 1.Arthralgias and myalgias: Much improved with vitamin D replacement therapy 2.  Mild hot flashes: Being observed  Surveillance: 1. Breast Exam:12/17/2020: Benign 2. Rt Mammogram:08/07/2020 at [facility]: Benign , breast density category C 3.  Bone density at [facility] May 2020: T score -2.6: Osteoporosis Patient will repeat this later this year at her office with [facility].  Recommended bisphosphonate along with calcium and vitamin D.  She is not on any bisphosphonate at this time.  And takes calcium supplement intermittently.  RTC in 1 year     No orders of the defined types were placed in this encounter.  The patient has a good understanding of the overall plan. she agrees with it. she will call with any problems that may develop before the next visit here.  Total time spent: 20 mins including face to face time and time spent for planning, charting and coordination of care  Nicholas Lose, MD 12/17/2020  I, Cloyde Reams Dorshimer, am acting as scribe for Dr. Nicholas Lose.  I have reviewed the above documentation for accuracy and completeness, and I agree with the above.

## 2020-12-17 ENCOUNTER — Other Ambulatory Visit: Payer: Self-pay

## 2020-12-17 ENCOUNTER — Inpatient Hospital Stay: Payer: BC Managed Care – PPO | Attending: Hematology and Oncology | Admitting: Hematology and Oncology

## 2020-12-17 ENCOUNTER — Encounter (INDEPENDENT_AMBULATORY_CARE_PROVIDER_SITE_OTHER): Payer: No Typology Code available for payment source | Admitting: Ophthalmology

## 2020-12-17 DIAGNOSIS — Z9012 Acquired absence of left breast and nipple: Secondary | ICD-10-CM | POA: Diagnosis not present

## 2020-12-17 DIAGNOSIS — H35033 Hypertensive retinopathy, bilateral: Secondary | ICD-10-CM | POA: Diagnosis not present

## 2020-12-17 DIAGNOSIS — H43813 Vitreous degeneration, bilateral: Secondary | ICD-10-CM

## 2020-12-17 DIAGNOSIS — Z923 Personal history of irradiation: Secondary | ICD-10-CM | POA: Diagnosis not present

## 2020-12-17 DIAGNOSIS — Z79811 Long term (current) use of aromatase inhibitors: Secondary | ICD-10-CM | POA: Diagnosis not present

## 2020-12-17 DIAGNOSIS — C50412 Malignant neoplasm of upper-outer quadrant of left female breast: Secondary | ICD-10-CM | POA: Insufficient documentation

## 2020-12-17 DIAGNOSIS — Z170 Estrogen receptor positive status [ER+]: Secondary | ICD-10-CM | POA: Diagnosis not present

## 2020-12-17 DIAGNOSIS — C773 Secondary and unspecified malignant neoplasm of axilla and upper limb lymph nodes: Secondary | ICD-10-CM | POA: Diagnosis not present

## 2020-12-17 DIAGNOSIS — M810 Age-related osteoporosis without current pathological fracture: Secondary | ICD-10-CM | POA: Insufficient documentation

## 2020-12-17 DIAGNOSIS — H35721 Serous detachment of retinal pigment epithelium, right eye: Secondary | ICD-10-CM | POA: Diagnosis not present

## 2020-12-17 DIAGNOSIS — I10 Essential (primary) hypertension: Secondary | ICD-10-CM

## 2020-12-17 DIAGNOSIS — H353112 Nonexudative age-related macular degeneration, right eye, intermediate dry stage: Secondary | ICD-10-CM | POA: Diagnosis not present

## 2020-12-17 MED ORDER — ANASTROZOLE 1 MG PO TABS: 1.0000 mg | ORAL_TABLET | Freq: Every day | ORAL | 3 refills | Status: DC

## 2020-12-17 NOTE — Assessment & Plan Note (Signed)
Left mastectomy 07/11/2016: ILC grade 2, 6 cm, LCIS, 1/3 lymph nodes micrometastatic disease, ER 80%, PR 90%, HER-2 negative, Ki-67 3%, T3 N0 (i+) stage IIB pathologic stage Oncotype DX: 6, 7% ROR  Treatment Plan: Adjuvant antiestrogen therapy Withtamoxifen 20 mg daily until June 2018 (patient's last menstrual cycle was June 2017) andonce she becamemenopausal she is now on Anastrozole  Anastrozole Toxicities: 1.Arthralgias and myalgias: Much improved with vitamin D replacement therapy 2.  Mild hot flashes: Being observed  Surveillance: 1. Breast Exam:12/17/2020: Benign 2. Rt Mammogram:08/07/2020 at [facility]: Benign , breast density category C 3.  Bone density at [facility] May 2020: T score -2.6: Osteoporosis Recommended bisphosphonate along with calcium and vitamin D  RTC in 1 year

## 2021-02-16 ENCOUNTER — Other Ambulatory Visit: Payer: Self-pay | Admitting: Hematology and Oncology

## 2021-05-17 ENCOUNTER — Other Ambulatory Visit: Payer: Self-pay | Admitting: Hematology and Oncology

## 2021-08-10 ENCOUNTER — Telehealth: Payer: Self-pay | Admitting: *Deleted

## 2021-08-10 NOTE — Telephone Encounter (Signed)
Received call from pt with complaint of severe joint pain causing difficulty with walking.  Pt states she will complete 5 years of AI therapy in January 2023.  Pt requesting advice from MD to stop Anastrozole now due to side effects.  Per MD okay for pt to stop Anastrozole now.  Pt verbalized understanding and appreciative of advice.

## 2021-12-16 NOTE — Assessment & Plan Note (Signed)
Left mastectomy 07/11/2016: ILC grade 2, 6 cm, LCIS, 1/3 lymph nodes micrometastatic disease, ER 80%, PR 90%, HER-2 negative, Ki-67 3%, T3 N0 (i+) stage IIB pathologic stage Oncotype DX: 6, 7% ROR  Treatment Plan: Adjuvant antiestrogen therapy Withtamoxifen 20 mg daily until June 2018 (patient's last menstrual cycle was June 2017) andonce she becamemenopausal she is now on Anastrozole  Anastrozole Toxicities: 1.Arthralgias and myalgias:Much improved with vitamin D replacement therapy 2.Mild hot flashes: Being observed  Surveillance: 1. Breast Exam:12/17/2020: Benign 2. Rt Mammogram:08/13/2021 at [facility]: Benign, breast density category C 3.Bone density at [facility] May 2020: T score -2.5: Osteoporosis Patient will repeat this later this year at her office with [facility].  Recommended bisphosphonate along with calcium and vitamin D.  She is not on any bisphosphonate at this time.  And takes calcium supplement intermittently.  RTC in 1 year

## 2021-12-17 ENCOUNTER — Other Ambulatory Visit: Payer: Self-pay

## 2021-12-17 ENCOUNTER — Inpatient Hospital Stay: Payer: BC Managed Care – PPO | Attending: Hematology and Oncology | Admitting: Hematology and Oncology

## 2021-12-17 ENCOUNTER — Encounter (INDEPENDENT_AMBULATORY_CARE_PROVIDER_SITE_OTHER): Payer: BC Managed Care – PPO | Admitting: Ophthalmology

## 2021-12-17 DIAGNOSIS — H353112 Nonexudative age-related macular degeneration, right eye, intermediate dry stage: Secondary | ICD-10-CM

## 2021-12-17 DIAGNOSIS — I10 Essential (primary) hypertension: Secondary | ICD-10-CM | POA: Diagnosis not present

## 2021-12-17 DIAGNOSIS — Z9012 Acquired absence of left breast and nipple: Secondary | ICD-10-CM | POA: Diagnosis not present

## 2021-12-17 DIAGNOSIS — H43813 Vitreous degeneration, bilateral: Secondary | ICD-10-CM

## 2021-12-17 DIAGNOSIS — H35033 Hypertensive retinopathy, bilateral: Secondary | ICD-10-CM

## 2021-12-17 DIAGNOSIS — C50412 Malignant neoplasm of upper-outer quadrant of left female breast: Secondary | ICD-10-CM | POA: Insufficient documentation

## 2021-12-17 DIAGNOSIS — H2513 Age-related nuclear cataract, bilateral: Secondary | ICD-10-CM

## 2021-12-17 DIAGNOSIS — Z170 Estrogen receptor positive status [ER+]: Secondary | ICD-10-CM | POA: Insufficient documentation

## 2021-12-17 NOTE — Progress Notes (Signed)
Patient Care Team: Georgianne Fick, MD as PCP - General (Internal Medicine) Emelia Loron, MD as Consulting Physician (General Surgery) Serena Croissant, MD as Consulting Physician (Hematology and Oncology) Dorothy Puffer, MD as Consulting Physician (Radiation Oncology) Axel Filler Larna Daughters, NP as Nurse Practitioner (Hematology and Oncology)  DIAGNOSIS:  Encounter Diagnosis  Name Primary?   Malignant neoplasm of upper-outer quadrant of left breast in female, estrogen receptor positive (H[facility])     SUMMARY OF ONCOLOGIC HISTORY: Oncology History  Breast cancer of upper-outer quadrant of left female breast ([facility])  06/22/2016 Initial Diagnosis   Left breast biopsies subareolar: 3 biopsies showing multifocal invasive lobular carcinoma with LCIS grade 2 with lymphovascular invasion, ER 80%, PR 90%, Ki-67 3%, HER-2 negative ratio 1.13   07/05/2016 Breast MRI   Left breast biopsy 7:00: Grade 2 invasive ductal carcinoma, ER 95%, PR 95%, Ki-67 15%, HER-2 negative ratio 1.48; left breast distortion 1.2 x 1.2 x 0.8 cm; left breast cyst 1.1 cm, T1b N0 stage IA clinical stage   07/11/2016 Surgery   Left mastectomy: ILC grade 2, 6 cm, LCIS, 1/3 lymph nodes micrometastatic disease, ER 80%, PR 90%, HER-2 negative, Ki-67 3%, T3 N0 (i+) stage IIB pathologic stage   08/15/2016 Oncotype testing   Oncotype Dx 6: 7% ROR   09/05/2016 - 10/20/2016 Radiation Therapy    Adjuvant radiation therapy   12/06/2016 -  Anti-estrogen oral therapy   Tamoxifen switched to anastrozole 03/29/2017 (after she became menopausal)     CHIEF COMPLIANT:   INTERVAL HISTORY: Stephanie Dennis is a   ALLERGIES:  has No Known Allergies.  MEDICATIONS:  Current Outpatient Medications  Medication Sig Dispense Refill   anastrozole (ARIMIDEX) 1 MG tablet TAKE 1 TABLET BY MOUTH DAILY 90 tablet 2   Calcium Carbonate-Vitamin D (CALCIUM 500 + D) 500-125 MG-UNIT TABS Take 1 tablet by mouth 2 (two) times daily.     No current  facility-administered medications for this visit.    PHYSICAL EXAMINATION: ECOG PERFORMANCE STATUS: 1 - Symptomatic but completely ambulatory  Vitals:   12/17/21 0910  BP: 139/84  Pulse: 70  Resp: 18  Temp: (!) 97.3 F (36.3 C)  SpO2: 99%   Filed Weights   12/17/21 0910  Weight: 150 lb 8 oz (68.3 kg)    BREAST: No palpable masses or nodules in either right or left breasts. No palpable axillary supraclavicular or infraclavicular adenopathy no breast tenderness or nipple discharge. (exam performed in the presence of a chaperone)  LABORATORY DATA:  I have reviewed the data as listed CMP Latest Ref Rng & Units 07/06/2016  Glucose 70 - 140 mg/dl 82  BUN 7.0 - 23.6 mg/dL 69.8  Creatinine 0.6 - 1.1 mg/dL 0.7  Sodium 028 - 721 mEq/L 141  Potassium 3.5 - 5.1 mEq/L 3.7  CO2 22 - 29 mEq/L 26  Calcium 8.4 - 10.4 mg/dL 9.6  Total Protein 6.4 - 8.3 g/dL 7.0  Total Bilirubin 0.13 - 1.20 mg/dL 6.57  Alkaline Phos 40 - 150 U/L 55  AST 5 - 34 U/L 18  ALT 0 - 55 U/L 11    Lab Results  Component Value Date   WBC 4.4 07/06/2016   HGB 14.2 07/06/2016   HCT 42.5 07/06/2016   MCV 96.6 07/06/2016   PLT 210 07/06/2016   NEUTROABS 2.2 07/06/2016    ASSESSMENT & PLAN:  Breast cancer of upper-outer quadrant of left female breast ([facility]) Left mastectomy 07/11/2016: ILC grade 2, 6 cm, LCIS, 1/3 lymph nodes micrometastatic disease,  ER 80%, PR 90%, HER-2 negative, Ki-67 3%, T3 N0 (i+) stage IIB pathologic stage Oncotype DX: 6, 7% ROR   Treatment Plan: Adjuvant antiestrogen therapy With tamoxifen 20 mg daily until June 2018 (patient's last menstrual cycle was June 2017) and once she became menopausal she is now on Anastrozole   Anastrozole Toxicities: Because of severe arthralgias and myalgias she stopped anastrozole therapy It was so bad to the point that she was not even able to walk.   Surveillance: 1. Breast Exam: 12/17/2020: Benign 2. Rt Mammogram: 08/13/2021 [facility]: Benign , breast  density category C 3.  Bone density at [facility] 2022: T score - 3.6: Osteoporosis I strongly recommended bisphosphonate therapy again.    Recommended bisphosphonate along with calcium and vitamin D.  She is not on any bisphosphonate at this time.  And takes calcium supplement intermittently.  She will discuss this with her primary care physician and make a decision.      RTC in 1 year with long-term survivorship.  She will follow with Mendel Ryder annually for surveillance checkups.   No orders of the defined types were placed in this encounter.  The patient has a good understanding of the overall plan. she agrees with it. she will call with any problems that may develop before the next visit here. Total time spent: 30 mins including face to face time and time spent for planning, charting and co-ordination of care   Harriette Ohara, MD 12/17/21

## 2021-12-29 DIAGNOSIS — E559 Vitamin D deficiency, unspecified: Secondary | ICD-10-CM | POA: Diagnosis not present

## 2021-12-29 DIAGNOSIS — Z0000 Encounter for general adult medical examination without abnormal findings: Secondary | ICD-10-CM | POA: Diagnosis not present

## 2022-02-11 DIAGNOSIS — Z6822 Body mass index (BMI) 22.0-22.9, adult: Secondary | ICD-10-CM | POA: Diagnosis not present

## 2022-02-11 DIAGNOSIS — Z124 Encounter for screening for malignant neoplasm of cervix: Secondary | ICD-10-CM | POA: Diagnosis not present

## 2022-02-11 DIAGNOSIS — Z01419 Encounter for gynecological examination (general) (routine) without abnormal findings: Secondary | ICD-10-CM | POA: Diagnosis not present

## 2022-02-11 DIAGNOSIS — Z113 Encounter for screening for infections with a predominantly sexual mode of transmission: Secondary | ICD-10-CM | POA: Diagnosis not present

## 2022-02-22 DIAGNOSIS — R899 Unspecified abnormal finding in specimens from other organs, systems and tissues: Secondary | ICD-10-CM | POA: Diagnosis not present

## 2022-03-19 DIAGNOSIS — Z1212 Encounter for screening for malignant neoplasm of rectum: Secondary | ICD-10-CM | POA: Diagnosis not present

## 2022-03-19 DIAGNOSIS — Z1211 Encounter for screening for malignant neoplasm of colon: Secondary | ICD-10-CM | POA: Diagnosis not present

## 2022-03-27 LAB — EXTERNAL GENERIC LAB PROCEDURE: COLOGUARD: NEGATIVE

## 2022-03-27 LAB — COLOGUARD: COLOGUARD: NEGATIVE

## 2022-06-01 DIAGNOSIS — R002 Palpitations: Secondary | ICD-10-CM | POA: Diagnosis not present

## 2022-06-20 DIAGNOSIS — I499 Cardiac arrhythmia, unspecified: Secondary | ICD-10-CM | POA: Diagnosis not present

## 2022-06-30 ENCOUNTER — Encounter: Payer: Self-pay | Admitting: Cardiovascular Disease

## 2022-06-30 ENCOUNTER — Ambulatory Visit (INDEPENDENT_AMBULATORY_CARE_PROVIDER_SITE_OTHER): Payer: BC Managed Care – PPO | Admitting: Cardiovascular Disease

## 2022-06-30 VITALS — BP 108/72 | HR 61 | Ht 67.0 in | Wt 132.0 lb

## 2022-06-30 DIAGNOSIS — Z923 Personal history of irradiation: Secondary | ICD-10-CM | POA: Insufficient documentation

## 2022-06-30 DIAGNOSIS — I471 Supraventricular tachycardia: Secondary | ICD-10-CM | POA: Diagnosis not present

## 2022-06-30 DIAGNOSIS — R002 Palpitations: Secondary | ICD-10-CM

## 2022-06-30 NOTE — Patient Instructions (Signed)
Medication Instructions:  No changes *If you need a refill on your cardiac medications before your next appointment, please call your pharmacy*   Lab Work: None ordered If you have labs (blood work) drawn today and your tests are completely normal, you will receive your results only by: [place] (if you have MyChart) OR A paper copy in the mail If you have any lab test that is abnormal or we need to change your treatment, we will call you to review the results.   Testing/Procedures: Your physician has requested that you have an echocardiogram. Echocardiography is a painless test that uses sound waves to create images of your heart. It provides your doctor with information about the size and shape of your heart and how well your heart's chambers and valves are working. You may receive an ultrasound enhancing agent through an IV if needed to better visualize your heart during the echo.This procedure takes approximately one hour. There are no restrictions for this procedure. This will take place at the [address], [address].    Follow-Up: At [facility], you and your health needs are our priority.  As part of our continuing mission to provide you with exceptional heart care, we have created designated Provider Care Teams.  These Care Teams include your primary Cardiologist (physician) and Advanced Practice Providers (APPs -  Physician Assistants and Nurse Practitioners) who all work together to provide you with the care you need, when you need it.  We recommend signing up for the patient portal called "MyChart".  Sign up information is provided on this After Visit Summary.  MyChart is used to connect with patients for Virtual Visits (Telemedicine).  Patients are able to view lab/test results, encounter notes, upcoming appointments, etc.  Non-urgent messages can be sent to your provider as well.   To learn more about what you can do with MyChart, go to [email or web address].     Your next appointment:   Follow up as needed with Dr. Sallyanne Kuster  Important Information About Sugar

## 2022-06-30 NOTE — Progress Notes (Signed)
Cardiology Office Note:    Date:  06/30/2022   ID:  [name] Pinch, DOB 13-Aug-1966, MRN [id number]  PCP:  Merrilee Seashore, MD   [place] Providers Cardiologist:  Sanda Klein, MD     Referring MD: Merrilee Seashore, MD   No chief complaint on file. Stephanie Dennis is a 56 y.o. female who is being seen today for the evaluation of palpitations at the request of Merrilee Seashore, MD.   History of Present Illness:    Stephanie Dennis is a 55 y.o. female with a hx of left breast cancer (mastectomy and external beam radiation therapy 2017), otherwise excellent health who presents with complaints of palpitations.  These have been prominent over the last 4-5 weeks.  They occur randomly but appear to be particularly frequent in the last couple of hours of the evening before bedtime.  They do not associate dizziness, syncope, dyspnea or chest discomfort.  She has never experienced full-blown syncope.  She walks her dog 3-4 miles a day for about an hour every day.  She denies dyspnea on exertion, chest pain at rest or with exertion, orthopnea, PND, lower extremity edema, focal neurological complaints or claudication.  She does not have a history of hypertension, diabetes mellitus, hypercholesterolemia or smoking.  There is no family history of premature onset heart disease.  Her ECG is normal.  She wore a 2-week event monitor that showed rare PACs and PVCs and 2 brief episodes of nonsustained supraventricular tachycardia with a pattern consistent with ectopic atrial tachycardia (long RP mechanism, evidence of "warm up", 1 episode consisting of 5 beats at 184 bpm, the other consisting of 13 beats with a maximum of 125 bpm).  She has been prescribed propranolol as needed by her primary physician, but has not needed to use it yet.  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  Past Medical History:  Diagnosis Date   Breast cancer ([place])    Breast cancer of upper-outer  quadrant of left female breast ([place]) 06/24/2016    Past Surgical History:  Procedure Laterality Date   CESAREAN SECTION     MASTECTOMY W/ SENTINEL NODE BIOPSY Left 07/11/2016   Procedure: LEFT TOTAL MASTECTOMY WITH SENTINEL LYMPH NODE BIOPSY;  Surgeon: Rolm Bookbinder, MD;  Location: [facility];  Service: General;  Laterality: Left;   WISDOM TOOTH EXTRACTION      Current Medications: Current Meds  Medication Sig   alendronate (FOSAMAX) 70 MG tablet Take 70 mg by mouth once a week. Saturdays   Calcium Carbonate-Vitamin D (CALCIUM 500 + D) 500-125 MG-UNIT TABS Take 1 tablet by mouth 2 (two) times daily.   Multiple Vitamins-Minerals (PRESERVISION AREDS 2 PO) Take 1 tablet by mouth daily in the afternoon.   propranolol (INDERAL) 20 MG tablet Take 20 mg by mouth 3 (three) times daily as needed.     Allergies:   Patient has no known allergies.   Social History   Socioeconomic History   Marital status: Single    Spouse name: Not on file   Number of children: Not on file   Years of education: Not on file   Highest education level: Not on file  Occupational History   Not on file  Tobacco Use   Smoking status: Never   Smokeless tobacco: Never  Substance and Sexual Activity   Alcohol use: Yes    Alcohol/week: 1.0 - 2.0 standard drink of alcohol    Types: 1 - 2 Glasses of wine per  week   Drug use: No   Sexual activity: Not Currently    Birth control/protection: Abstinence  Other Topics Concern   Not on file  Social History Narrative   Not on file   Social Determinants of Health   Financial Resource Strain: Not on file  Food Insecurity: Not on file  Transportation Needs: Not on file  Physical Activity: Not on file  Stress: Not on file  Social Connections: Not on file     Family History: The patient's family history includes Breast cancer in her mother; Prostate cancer in her father.  ROS:   Please see the history of present illness.     All other systems  reviewed and are negative.  EKGs/Labs/Other Studies Reviewed:    The following studies were reviewed today: 14-day event monitor 06/09/2022   EKG:  EKG is ordered today.  The ekg ordered today demonstrates normal sinus rhythm, RSR prime pattern in lead V1 but QRS duration only 88 ms, otherwise completely normal ECG  Recent Labs: No results found for requested labs within last 365 days.  Recent Lipid Panel No results found for: "CHOL", "TRIG", "HDL", "CHOLHDL", "VLDL", "LDLCALC", "LDLDIRECT"  05/24/2022 TSH 1.01, potassium 3.7, magnesium 1.9, creatinine 0.78, normal liver function tests, hemoglobin 11.7 with normocytic indices, TSH 1.08 Cholesterol 220, HDL 84, LDL 123, triglycerides 66  Risk Assessment/Calculations:           Physical Exam:    VS:  BP 108/72 (BP Location: Right Arm, Patient Position: Sitting, Cuff Size: Normal)   Pulse 61   Ht '5\' 7"'$  (1.702 m)   Wt 132 lb (59.9 kg)   SpO2 96%   BMI 20.67 kg/m     Wt Readings from Last 3 Encounters:  06/30/22 132 lb (59.9 kg)  12/17/21 150 lb 8 oz (68.3 kg)  12/17/20 145 lb 4.8 oz (65.9 kg)     GEN: Appears lean and fit, well nourished, well developed in no acute distress HEENT: Normal NECK: No JVD; No carotid bruits LYMPHATICS: No lymphadenopathy CARDIAC: RRR, no murmurs, rubs, gallops RESPIRATORY:  Clear to auscultation without rales, wheezing or rhonchi  ABDOMEN: Soft, non-tender, non-distended MUSCULOSKELETAL:  No edema; No deformity  SKIN: Warm and dry NEUROLOGIC:  Alert and oriented x 3 PSYCHIATRIC:  Normal affect   ASSESSMENT:    1. Palpitations   2. Ectopic atrial tachycardia (HCC)   3. History of external beam radiation therapy    PLAN:    In order of problems listed above:  Ectopic atrial tachycardia: Clinically there is no evidence of structural heart disease, but in view of previous history of chest radiation therapy will go ahead and check an echocardiogram.  She is more aware of the PACs than  of the atrial tachycardia.  These only require treatment as needed if the symptoms are severe and propranolol is a good first choice. History of chest radiation therapy: Increases the risk of valvular heart disease and also coronary artery disease, but this would be expected to occur later in life.  The atrial arrhythmia is unlikely to correlate with coronary insufficiency, but will use the echo to screen for valvular abnormalities.           Medication Adjustments/Labs and Tests Ordered: Current medicines are reviewed at length with the patient today.  Concerns regarding medicines are outlined above.  Orders Placed This Encounter  Procedures   EKG 12-Lead   ECHOCARDIOGRAM COMPLETE   No orders of the defined types were placed in this encounter.  Patient Instructions  Medication Instructions:  No changes *If you need a refill on your cardiac medications before your next appointment, please call your pharmacy*   Lab Work: None ordered If you have labs (blood work) drawn today and your tests are completely normal, you will receive your results only by: [place] (if you have MyChart) OR A paper copy in the mail If you have any lab test that is abnormal or we need to change your treatment, we will call you to review the results.   Testing/Procedures: Your physician has requested that you have an echocardiogram. Echocardiography is a painless test that uses sound waves to create images of your heart. It provides your doctor with information about the size and shape of your heart and how well your heart's chambers and valves are working. You may receive an ultrasound enhancing agent through an IV if needed to better visualize your heart during the echo.This procedure takes approximately one hour. There are no restrictions for this procedure. This will take place at the [address], [address].    Follow-Up: At [facility], you and your health needs are our priority.  As  part of our continuing mission to provide you with exceptional heart care, we have created designated Provider Care Teams.  These Care Teams include your primary Cardiologist (physician) and Advanced Practice Providers (APPs -  Physician Assistants and Nurse Practitioners) who all work together to provide you with the care you need, when you need it.  We recommend signing up for the patient portal called "MyChart".  Sign up information is provided on this After Visit Summary.  MyChart is used to connect with patients for Virtual Visits (Telemedicine).  Patients are able to view lab/test results, encounter notes, upcoming appointments, etc.  Non-urgent messages can be sent to your provider as well.   To learn more about what you can do with MyChart, go to [email or web address].    Your next appointment:   Follow up as needed with Dr. Sallyanne Kuster  Important Information About Sugar         Signed, Sanda Klein, MD  06/30/2022 12:31 PM    [place]

## 2022-07-13 ENCOUNTER — Ambulatory Visit: Payer: BC Managed Care – PPO | Admitting: Cardiovascular Disease

## 2022-07-22 ENCOUNTER — Ambulatory Visit (HOSPITAL_COMMUNITY): Payer: BC Managed Care – PPO | Attending: Cardiology

## 2022-07-22 DIAGNOSIS — R002 Palpitations: Secondary | ICD-10-CM | POA: Diagnosis not present

## 2022-07-23 LAB — ECHOCARDIOGRAM COMPLETE
Area-P 1/2: 3.48 cm2
S' Lateral: 2.9 cm

## 2022-07-24 ENCOUNTER — Encounter: Payer: Self-pay | Admitting: Cardiovascular Disease

## 2022-07-25 ENCOUNTER — Encounter: Payer: Self-pay | Admitting: Cardiovascular Disease

## 2022-07-25 NOTE — Telephone Encounter (Signed)
The tracings show frequent premature atrial contractions with mild aberrant conduction.  I would strongly encourage her to try taking the propranolol as needed.

## 2022-08-26 DIAGNOSIS — Z1231 Encounter for screening mammogram for malignant neoplasm of breast: Secondary | ICD-10-CM | POA: Diagnosis not present

## 2022-12-19 ENCOUNTER — Encounter: Payer: BC Managed Care – PPO | Admitting: Adult Health

## 2022-12-19 ENCOUNTER — Encounter (INDEPENDENT_AMBULATORY_CARE_PROVIDER_SITE_OTHER): Payer: BC Managed Care – PPO | Admitting: Ophthalmology

## 2022-12-19 DIAGNOSIS — H353121 Nonexudative age-related macular degeneration, left eye, early dry stage: Secondary | ICD-10-CM

## 2022-12-19 DIAGNOSIS — H353112 Nonexudative age-related macular degeneration, right eye, intermediate dry stage: Secondary | ICD-10-CM | POA: Diagnosis not present

## 2022-12-19 DIAGNOSIS — H35033 Hypertensive retinopathy, bilateral: Secondary | ICD-10-CM | POA: Diagnosis not present

## 2022-12-19 DIAGNOSIS — I10 Essential (primary) hypertension: Secondary | ICD-10-CM

## 2022-12-19 DIAGNOSIS — H43813 Vitreous degeneration, bilateral: Secondary | ICD-10-CM

## 2023-06-13 DIAGNOSIS — M8589 Other specified disorders of bone density and structure, multiple sites: Secondary | ICD-10-CM | POA: Diagnosis not present

## 2023-09-08 DIAGNOSIS — Z1231 Encounter for screening mammogram for malignant neoplasm of breast: Secondary | ICD-10-CM | POA: Diagnosis not present

## 2023-12-21 ENCOUNTER — Encounter (INDEPENDENT_AMBULATORY_CARE_PROVIDER_SITE_OTHER): Payer: BC Managed Care – PPO | Admitting: Ophthalmology

## 2023-12-21 DIAGNOSIS — H43813 Vitreous degeneration, bilateral: Secondary | ICD-10-CM

## 2023-12-21 DIAGNOSIS — H353112 Nonexudative age-related macular degeneration, right eye, intermediate dry stage: Secondary | ICD-10-CM

## 2023-12-21 DIAGNOSIS — H2513 Age-related nuclear cataract, bilateral: Secondary | ICD-10-CM | POA: Diagnosis not present

## 2023-12-25 ENCOUNTER — Encounter (INDEPENDENT_AMBULATORY_CARE_PROVIDER_SITE_OTHER): Payer: BC Managed Care – PPO | Admitting: Ophthalmology

## 2024-02-28 DIAGNOSIS — M25551 Pain in right hip: Secondary | ICD-10-CM | POA: Diagnosis not present

## 2024-03-15 DIAGNOSIS — M25551 Pain in right hip: Secondary | ICD-10-CM | POA: Diagnosis not present

## 2024-03-15 DIAGNOSIS — M1611 Unilateral primary osteoarthritis, right hip: Secondary | ICD-10-CM | POA: Diagnosis not present

## 2024-03-20 DIAGNOSIS — Z01818 Encounter for other preprocedural examination: Secondary | ICD-10-CM | POA: Diagnosis not present

## 2024-03-21 DIAGNOSIS — Z0000 Encounter for general adult medical examination without abnormal findings: Secondary | ICD-10-CM | POA: Diagnosis not present

## 2024-06-12 DIAGNOSIS — M1611 Unilateral primary osteoarthritis, right hip: Secondary | ICD-10-CM | POA: Diagnosis not present

## 2024-06-12 DIAGNOSIS — M25751 Osteophyte, right hip: Secondary | ICD-10-CM | POA: Diagnosis not present

## 2024-09-13 DIAGNOSIS — Z1231 Encounter for screening mammogram for malignant neoplasm of breast: Secondary | ICD-10-CM | POA: Diagnosis not present

## 2024-10-18 DIAGNOSIS — M7551 Bursitis of right shoulder: Secondary | ICD-10-CM | POA: Diagnosis not present

## 2024-10-28 DIAGNOSIS — Z0000 Encounter for general adult medical examination without abnormal findings: Secondary | ICD-10-CM | POA: Diagnosis not present

## 2024-10-28 DIAGNOSIS — Z8249 Family history of ischemic heart disease and other diseases of the circulatory system: Secondary | ICD-10-CM | POA: Diagnosis not present

## 2024-12-20 ENCOUNTER — Encounter (INDEPENDENT_AMBULATORY_CARE_PROVIDER_SITE_OTHER): Payer: BC Managed Care – PPO | Admitting: Ophthalmology

## 2024-12-20 DIAGNOSIS — H35721 Serous detachment of retinal pigment epithelium, right eye: Secondary | ICD-10-CM | POA: Diagnosis not present

## 2024-12-20 DIAGNOSIS — H353112 Nonexudative age-related macular degeneration, right eye, intermediate dry stage: Secondary | ICD-10-CM | POA: Diagnosis not present

## 2024-12-20 DIAGNOSIS — H43813 Vitreous degeneration, bilateral: Secondary | ICD-10-CM | POA: Diagnosis not present

## 2024-12-20 DIAGNOSIS — I10 Essential (primary) hypertension: Secondary | ICD-10-CM

## 2024-12-20 DIAGNOSIS — H35033 Hypertensive retinopathy, bilateral: Secondary | ICD-10-CM | POA: Diagnosis not present

## 2024-12-20 DIAGNOSIS — H2513 Age-related nuclear cataract, bilateral: Secondary | ICD-10-CM

## 2025-12-26 ENCOUNTER — Encounter (INDEPENDENT_AMBULATORY_CARE_PROVIDER_SITE_OTHER): Admitting: Ophthalmology
